# Patient Record
Sex: Male | Born: 1990 | Race: Black or African American | Hispanic: No | Marital: Married | Smoking: Never smoker
Health system: Southern US, Community
[De-identification: ages and names within clinical notes are randomized; demographics above are authoritative.]

## PROBLEM LIST (undated history)

## (undated) ENCOUNTER — Emergency Department (HOSPITAL_COMMUNITY): Payer: Self-pay | Source: Home / Self Care

---
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
## 2008-08-30 ENCOUNTER — Emergency Department: Payer: Self-pay | Admitting: Emergency Medicine

## 2009-02-11 ENCOUNTER — Emergency Department: Payer: Self-pay | Admitting: Unknown Physician Specialty

## 2009-05-18 ENCOUNTER — Emergency Department: Payer: Self-pay | Admitting: Emergency Medicine

## 2009-09-12 ENCOUNTER — Emergency Department: Payer: Self-pay | Admitting: Emergency Medicine

## 2010-09-21 IMAGING — CR DG ANKLE COMPLETE 3+V*L*
1 series · 5 of 5 positions shown · non-contrast
Comparison: none

REASON FOR EXAM: INJJURY
COMMENTS:   LMP: (Male)

PROCEDURE:     DXR - DXR ANKLE LEFT COMPLETE  - August 30, 2008  [DATE]
RESULT:     A small osseous density projects along the distal medial
malleolar region. This area is suspicious for a small avulsion fragment. No
further evidence of fracture or dislocation is appreciated.

[Series 1: view not recorded · 0.17mm/px · 5 of 5 slices shown]
[im 1/5]
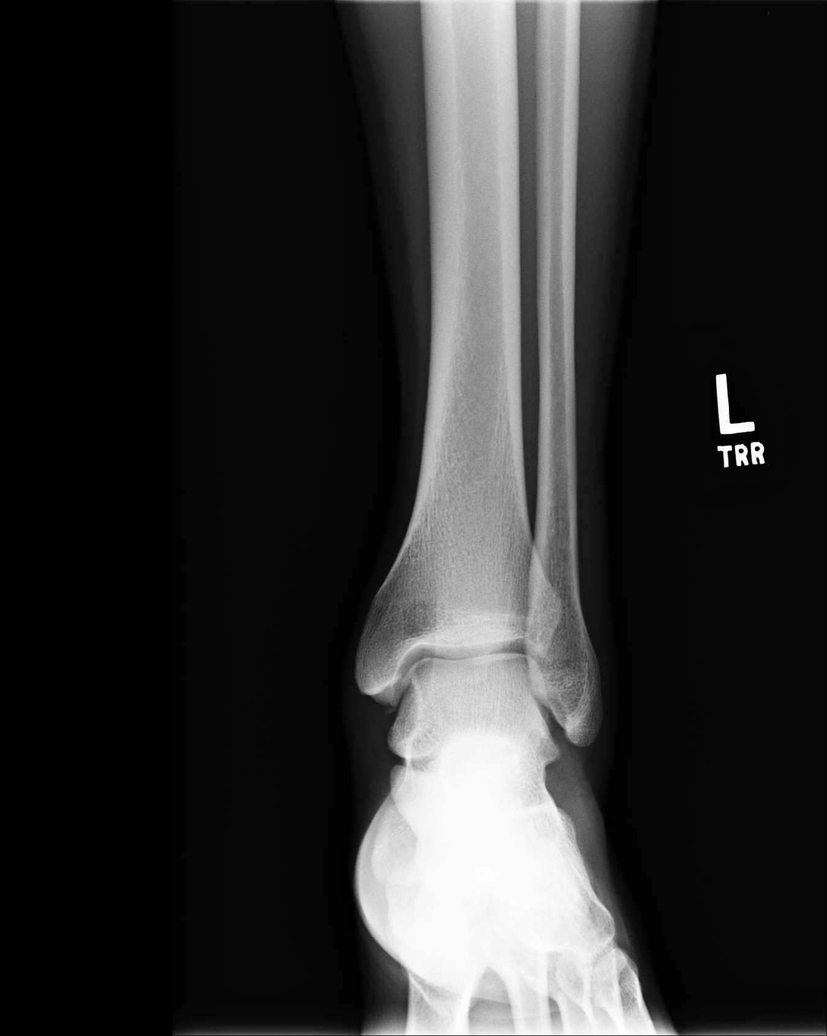
[im 2/5]
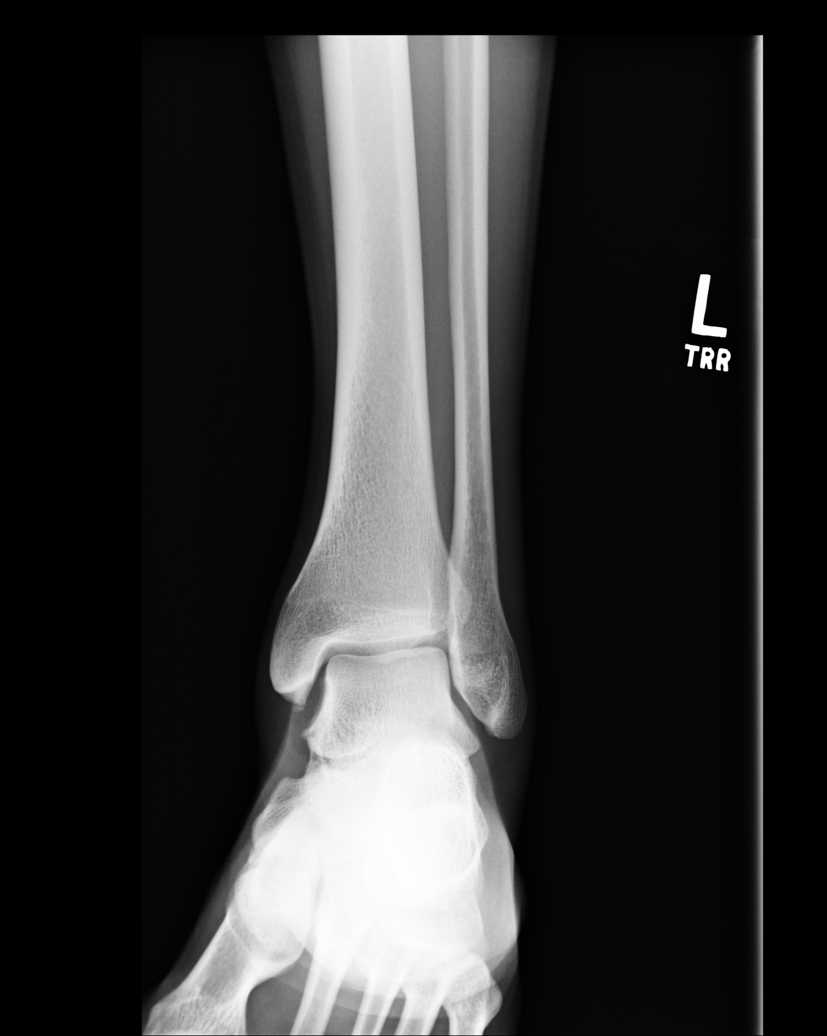
[im 3/5]
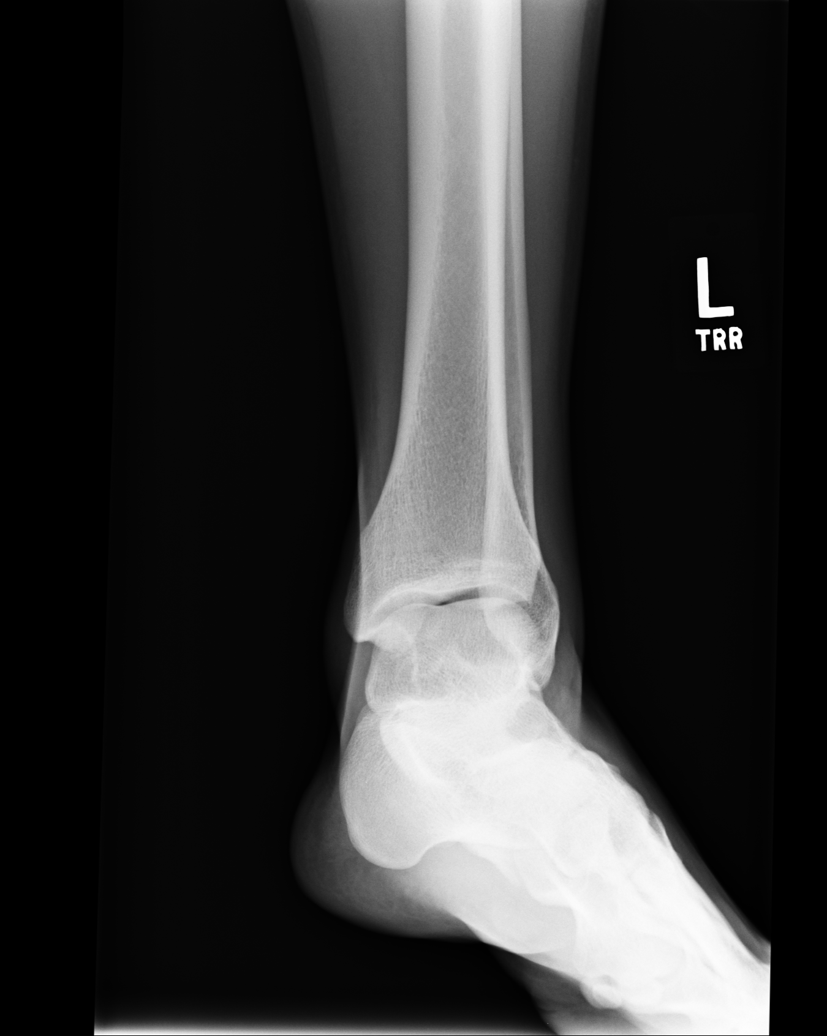
[im 4/5]
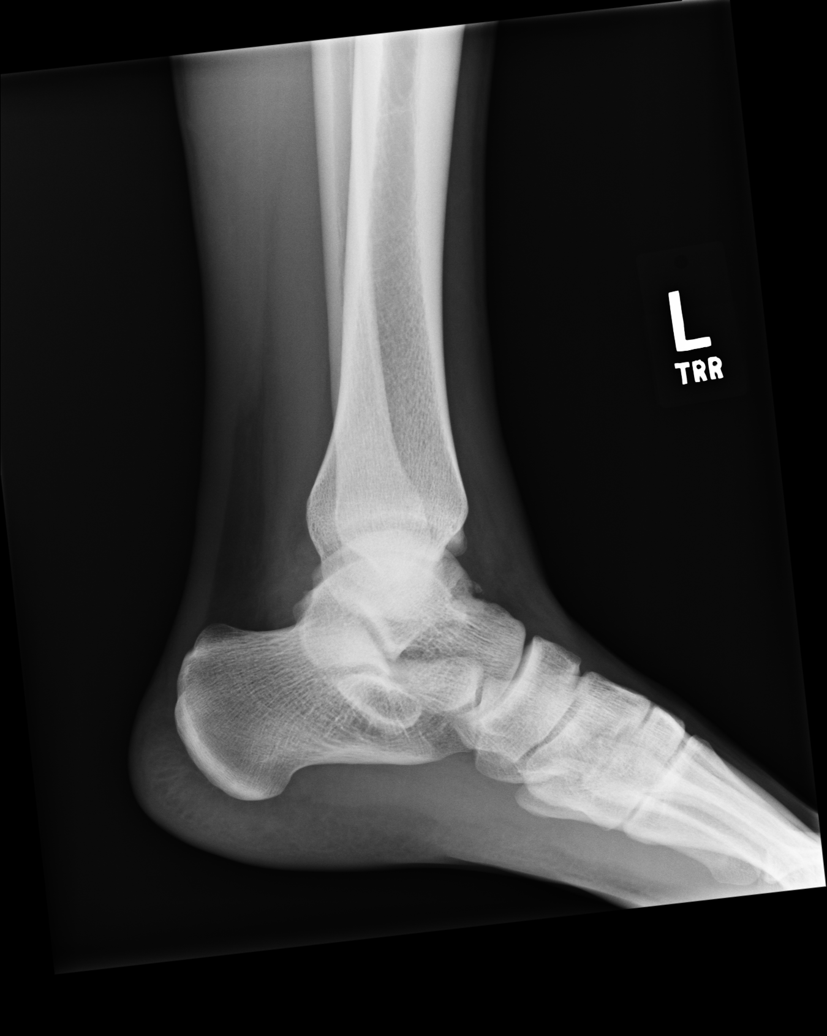
[im 5/5]
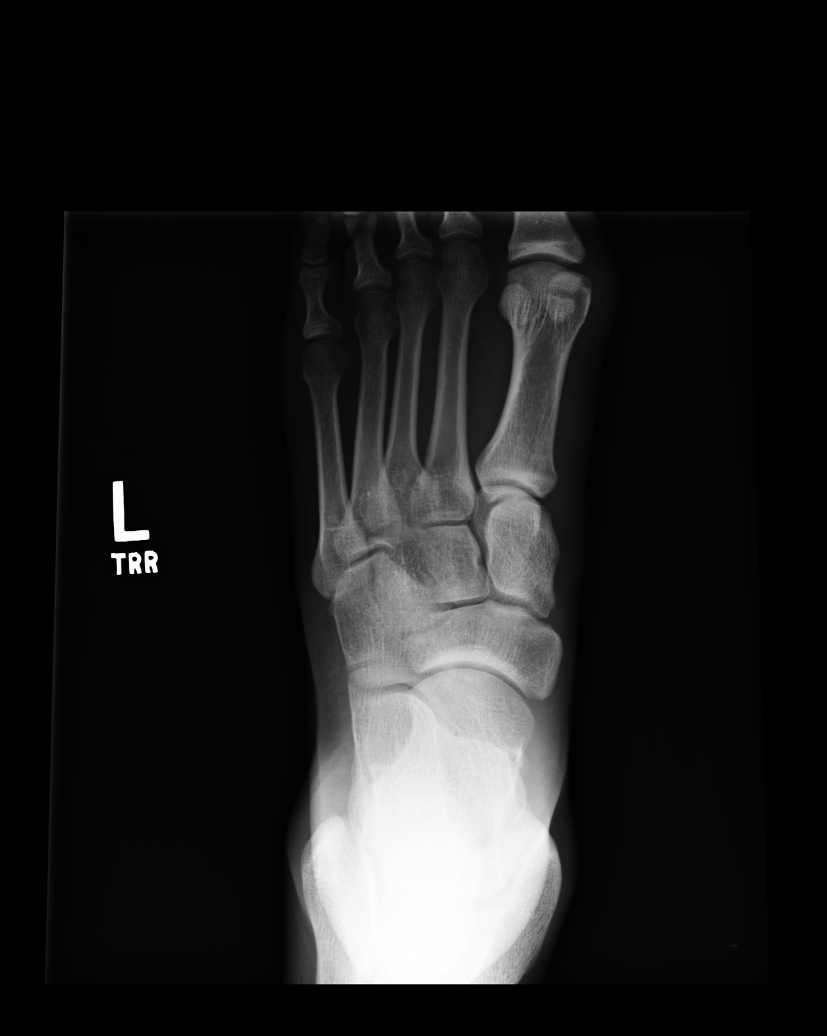

[5 of 5 positions shown; findings below may reference images not displayed]

IMPRESSION: Findings possibly representing a small avulsion fragment
along the distal medial malleolar region.

## 2010-12-11 LAB — HM HIV SCREENING LAB: HM HIV Screening: NEGATIVE

## 2011-03-03 ENCOUNTER — Emergency Department: Payer: Self-pay | Admitting: Emergency Medicine

## 2011-03-05 IMAGING — CR RIGHT THUMB 2+V
1 series · 3 of 3 positions shown · non-contrast
Comparison: No comparison

REASON FOR EXAM: injury
COMMENTS:

PROCEDURE:     DXR - DXR THUMB RIGHT HAND (1ST DIGIT)  - February 11, 2009  [DATE]
RESULT:     History: Injury

[Series 1: view not recorded · 0.17mm/px · 3 of 3 slices shown]
[im 1/3]
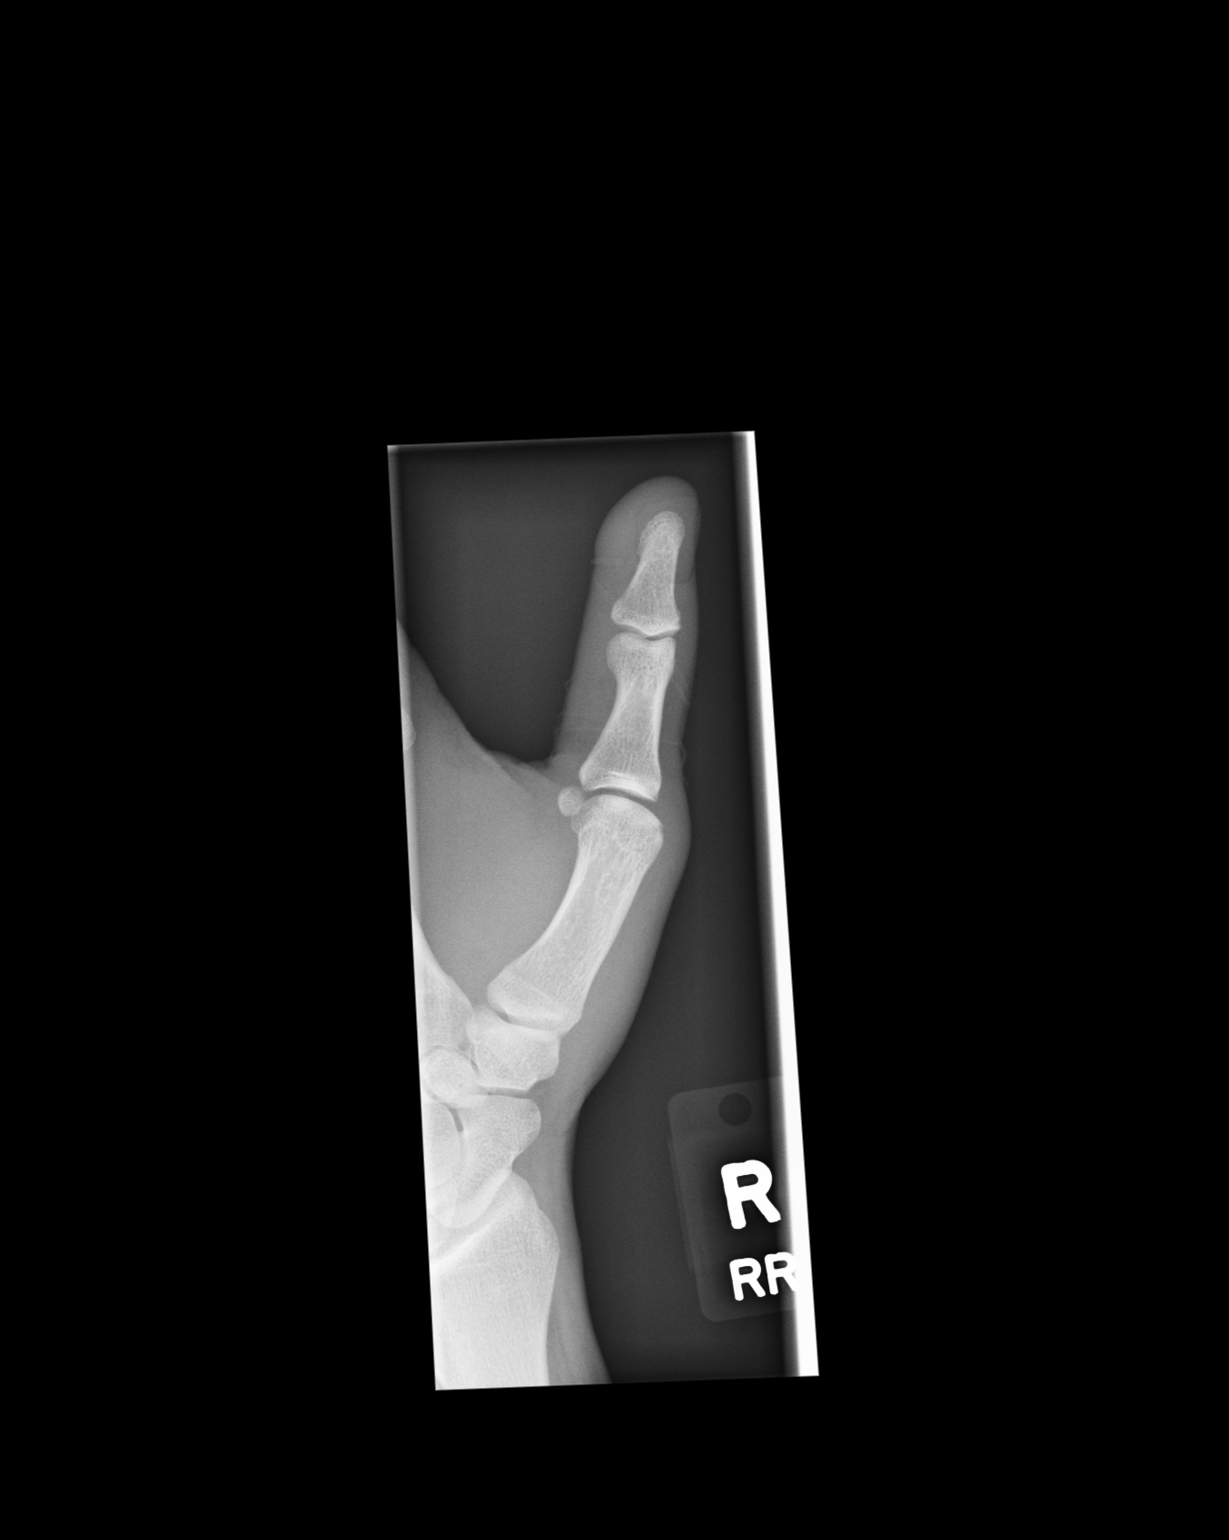
[im 2/3]
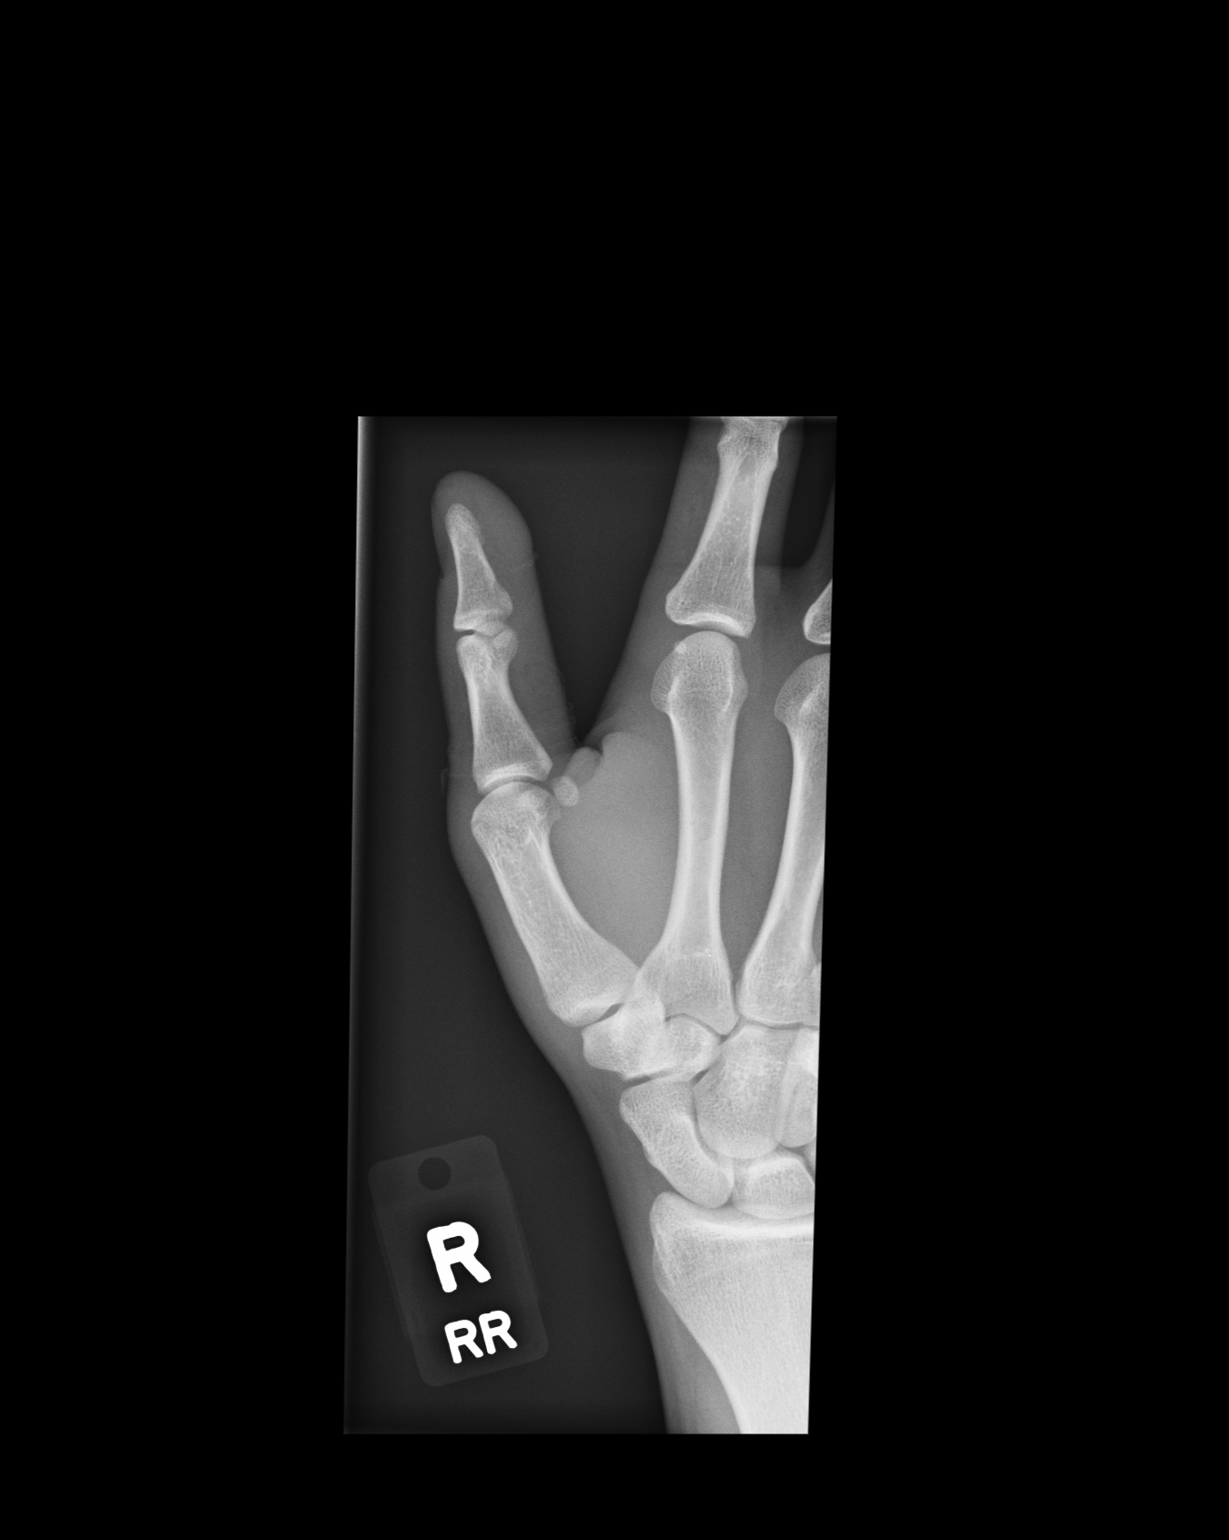
[im 3/3]
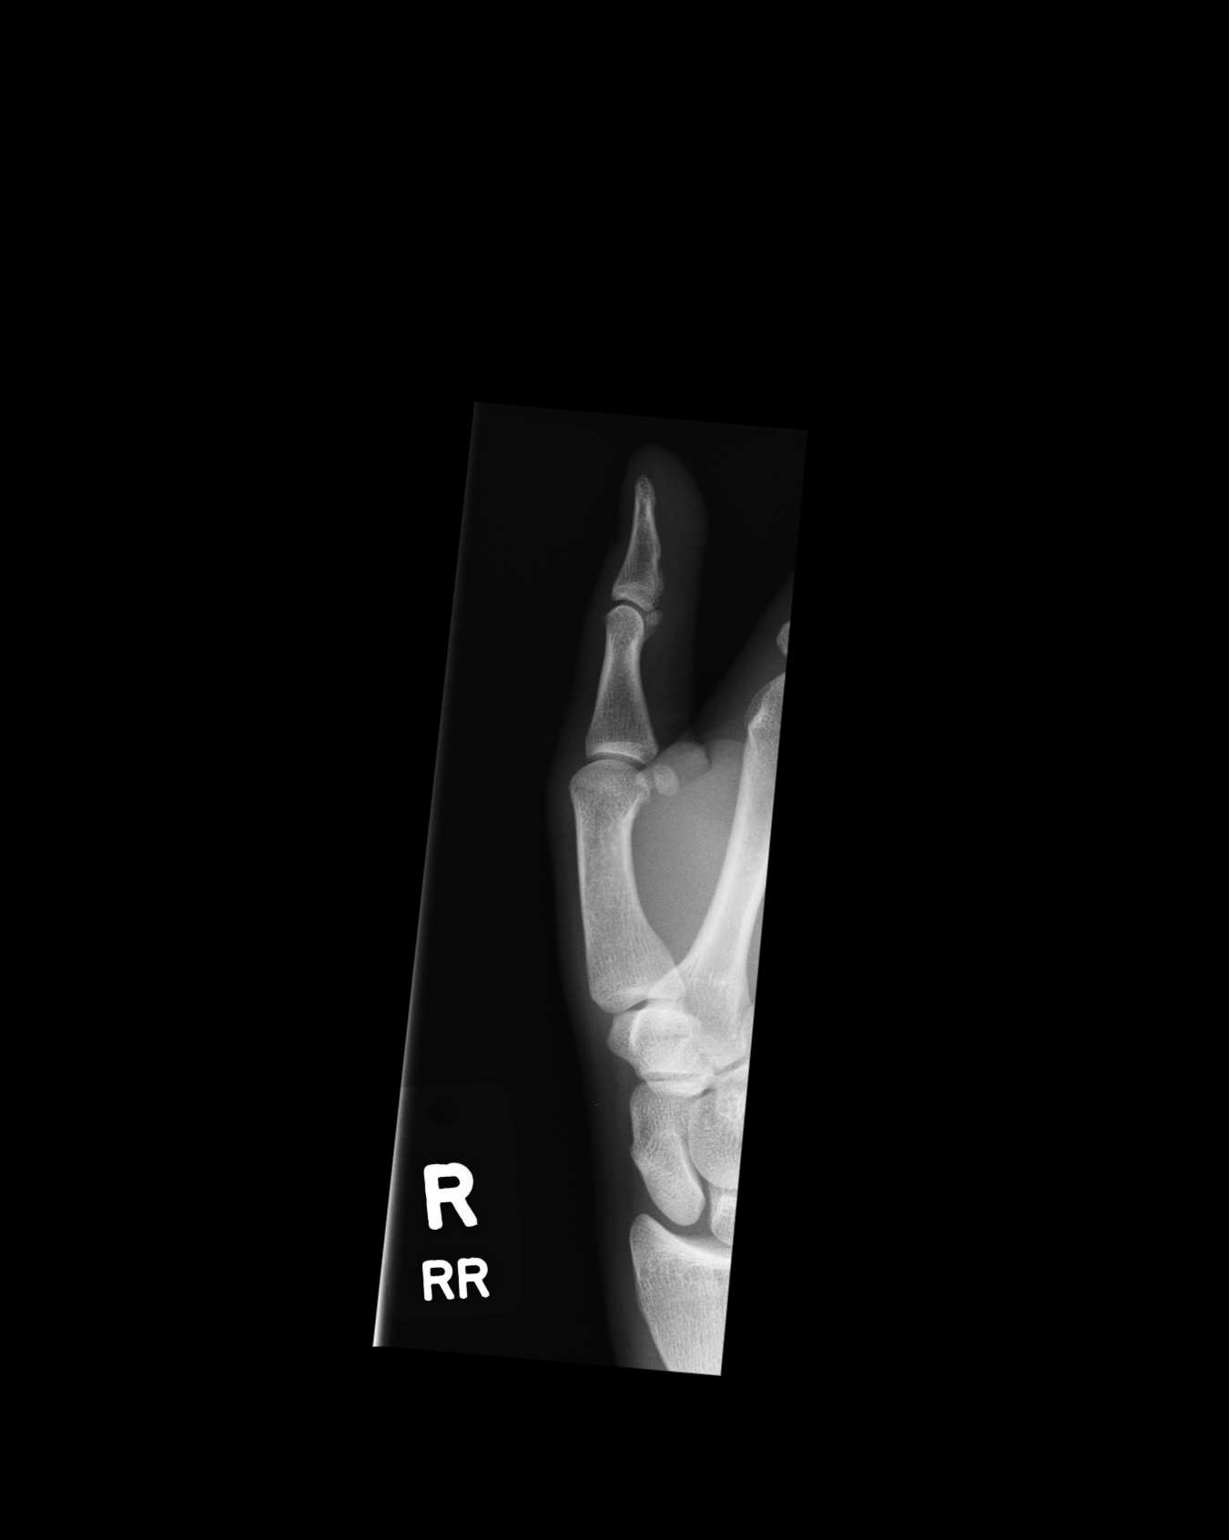

[3 of 3 positions shown; findings below may reference images not displayed]

FINDINGS: Three coned-down views of the right thumb demonstrate no fracture or
dislocation. Soft tissues are normal.
IMPRESSION: No acute osseous injury of the left thumb.

## 2012-11-29 ENCOUNTER — Emergency Department: Payer: Self-pay | Admitting: Emergency Medicine

## 2012-11-29 LAB — RAPID INFLUENZA A&B ANTIGENS

## 2013-01-12 ENCOUNTER — Emergency Department: Payer: Self-pay | Admitting: Emergency Medicine

## 2013-01-12 LAB — URINALYSIS, COMPLETE
Bacteria: NONE SEEN
Bilirubin,UR: NEGATIVE
Blood: NEGATIVE
Glucose,UR: NEGATIVE mg/dL (ref 0–75)
Leukocyte Esterase: NEGATIVE
Nitrite: NEGATIVE
Ph: 6 (ref 4.5–8.0)
Protein: NEGATIVE
RBC,UR: 1 /HPF (ref 0–5)
Specific Gravity: 1.028 (ref 1.003–1.030)
Squamous Epithelial: <1
WBC UR: 4 /HPF (ref 0–5)

## 2013-01-12 LAB — RAPID INFLUENZA A&B ANTIGENS

## 2013-02-07 ENCOUNTER — Emergency Department: Payer: Self-pay | Admitting: Emergency Medicine

## 2013-02-07 LAB — COMPREHENSIVE METABOLIC PANEL
Albumin: 4.1 g/dL (ref 3.4–5.0)
Alkaline Phosphatase: 82 U/L (ref 50–136)
Anion Gap: 3 — ABNORMAL LOW (ref 7–16)
BUN: 15 mg/dL (ref 7–18)
Bilirubin,Total: 0.5 mg/dL (ref 0.2–1.0)
Calcium, Total: 9 mg/dL (ref 8.5–10.1)
Chloride: 104 mmol/L (ref 98–107)
Co2: 31 mmol/L (ref 21–32)
Creatinine: 1.05 mg/dL (ref 0.60–1.30)
EGFR (African American): >60
EGFR (Non-African Amer.): >60
Glucose: 96 mg/dL (ref 65–99)
Osmolality: 276 (ref 275–301)
Potassium: 3.8 mmol/L (ref 3.5–5.1)
SGOT(AST): 28 U/L (ref 15–37)
SGPT (ALT): 27 U/L (ref 12–78)
Sodium: 138 mmol/L (ref 136–145)
Total Protein: 7.7 g/dL (ref 6.4–8.2)

## 2013-02-07 LAB — URINALYSIS, COMPLETE
Bilirubin,UR: NEGATIVE
Blood: NEGATIVE
Glucose,UR: NEGATIVE mg/dL (ref 0–75)
Ketone: NEGATIVE
Nitrite: NEGATIVE
Ph: 7 (ref 4.5–8.0)
Protein: NEGATIVE
RBC,UR: 2 /HPF (ref 0–5)
Specific Gravity: 1.024 (ref 1.003–1.030)
Squamous Epithelial: <1
WBC UR: 10 /HPF (ref 0–5)

## 2013-02-07 LAB — CBC
HCT: 44.8 % (ref 40.0–52.0)
HGB: 14.7 g/dL (ref 13.0–18.0)
MCH: 27.8 pg (ref 26.0–34.0)
MCHC: 32.9 g/dL (ref 32.0–36.0)
MCV: 85 fL (ref 80–100)
Platelet: 235 10*3/uL (ref 150–440)
RBC: 5.3 10*6/uL (ref 4.40–5.90)
RDW: 13.6 % (ref 11.5–14.5)
WBC: 7.3 10*3/uL (ref 3.8–10.6)

## 2013-02-15 ENCOUNTER — Emergency Department: Payer: Self-pay | Admitting: Unknown Physician Specialty

## 2013-02-19 ENCOUNTER — Emergency Department: Payer: Self-pay | Admitting: Emergency Medicine

## 2013-12-11 ENCOUNTER — Emergency Department: Payer: Self-pay | Admitting: Emergency Medicine

## 2013-12-11 LAB — CBC
HCT: 43.9 % (ref 40.0–52.0)
HGB: 14.4 g/dL (ref 13.0–18.0)
MCH: 27.3 pg (ref 26.0–34.0)
MCHC: 32.7 g/dL (ref 32.0–36.0)
MCV: 83 fL (ref 80–100)
Platelet: 246 10*3/uL (ref 150–440)
RBC: 5.26 10*6/uL (ref 4.40–5.90)
RDW: 13.7 % (ref 11.5–14.5)
WBC: 7.7 10*3/uL (ref 3.8–10.6)

## 2013-12-11 LAB — CLOSTRIDIUM DIFFICILE(ARMC)

## 2013-12-11 LAB — BASIC METABOLIC PANEL
Anion Gap: 3 — ABNORMAL LOW (ref 7–16)
BUN: 13 mg/dL (ref 7–18)
CALCIUM: 9.6 mg/dL (ref 8.5–10.1)
CHLORIDE: 102 mmol/L (ref 98–107)
Co2: 30 mmol/L (ref 21–32)
Creatinine: 1.09 mg/dL (ref 0.60–1.30)
EGFR (African American): >60
Glucose: 83 mg/dL (ref 65–99)
OSMOLALITY: 269 (ref 275–301)
Potassium: 3.8 mmol/L (ref 3.5–5.1)
SODIUM: 135 mmol/L — AB (ref 136–145)

## 2013-12-14 LAB — STOOL CULTURE

## 2014-02-17 ENCOUNTER — Emergency Department: Payer: Self-pay | Admitting: Emergency Medicine

## 2014-05-05 ENCOUNTER — Emergency Department: Payer: Self-pay

## 2014-08-26 ENCOUNTER — Emergency Department: Payer: Self-pay | Admitting: Emergency Medicine

## 2014-08-26 LAB — BASIC METABOLIC PANEL
Anion Gap: 6 — ABNORMAL LOW (ref 7–16)
BUN: 13 mg/dL (ref 7–18)
Calcium, Total: 8.6 mg/dL (ref 8.5–10.1)
Chloride: 106 mmol/L (ref 98–107)
Co2: 29 mmol/L (ref 21–32)
Creatinine: 1.32 mg/dL — ABNORMAL HIGH (ref 0.60–1.30)
EGFR (African American): >60
EGFR (Non-African Amer.): >60
Glucose: 117 mg/dL — ABNORMAL HIGH (ref 65–99)
Osmolality: 282 (ref 275–301)
Potassium: 3.8 mmol/L (ref 3.5–5.1)
Sodium: 141 mmol/L (ref 136–145)

## 2014-08-26 LAB — CBC WITH DIFFERENTIAL/PLATELET
Basophil #: 0 10*3/uL (ref 0.0–0.1)
Basophil %: 0.6 %
Eosinophil #: 0.1 10*3/uL (ref 0.0–0.7)
Eosinophil %: 1.8 %
HCT: 44 % (ref 40.0–52.0)
HGB: 14 g/dL (ref 13.0–18.0)
Lymphocyte #: 2.7 10*3/uL (ref 1.0–3.6)
Lymphocyte %: 35.3 %
MCH: 27.4 pg (ref 26.0–34.0)
MCHC: 31.8 g/dL — ABNORMAL LOW (ref 32.0–36.0)
MCV: 86 fL (ref 80–100)
Monocyte #: 0.5 x10 3/mm (ref 0.2–1.0)
Monocyte %: 6.6 %
Neutrophil #: 4.2 10*3/uL (ref 1.4–6.5)
Neutrophil %: 55.7 %
Platelet: 254 10*3/uL (ref 150–440)
RBC: 5.1 10*6/uL (ref 4.40–5.90)
RDW: 14.1 % (ref 11.5–14.5)
WBC: 7.5 10*3/uL (ref 3.8–10.6)

## 2014-08-27 ENCOUNTER — Emergency Department: Payer: Self-pay | Admitting: Internal Medicine

## 2014-10-10 ENCOUNTER — Emergency Department: Payer: Self-pay | Admitting: Emergency Medicine

## 2015-03-11 ENCOUNTER — Emergency Department
Admit: 2015-03-11 | Disposition: A | Discharge status: Home / Self Care | Payer: Self-pay | Admitting: Emergency Medicine

## 2016-03-25 ENCOUNTER — Emergency Department
Admission: EM | Admit: 2016-03-25 | Discharge: 2016-03-25 | Disposition: A | Discharge status: Home / Self Care | Payer: Self-pay | Attending: Emergency Medicine | Admitting: Emergency Medicine

## 2016-03-25 DIAGNOSIS — B351 Tinea unguium: Secondary | ICD-10-CM | POA: Insufficient documentation

## 2016-03-25 DIAGNOSIS — B353 Tinea pedis: Secondary | ICD-10-CM | POA: Insufficient documentation

## 2016-03-25 MED ORDER — IBUPROFEN 800 MG PO TABS: 800.0000 mg | ORAL_TABLET | Freq: Three times a day (TID) | ORAL | Status: DC | PRN

## 2016-03-25 MED ORDER — TERBINAFINE HCL 250 MG PO TABS: 250.0000 mg | ORAL_TABLET | Freq: Every day | ORAL | Status: DC

## 2016-03-25 MED ORDER — HYDROCODONE-ACETAMINOPHEN 5-325 MG PO TABS
1.0000 | ORAL_TABLET | ORAL | Status: DC | PRN
Start: 2016-03-25 — End: 2018-06-16

## 2016-03-25 NOTE — ED Notes (Signed)
See triage note  States he has been having pain to right foot for several days   Callous noted note with fungus area noted to foot

## 2016-03-25 NOTE — Discharge Instructions (Signed)
Athlete's Foot Athlete's foot (tinea pedis) is a fungal infection of the skin on the feet. It often occurs on the skin between the toes or underneath the toes. It can also occur on the soles of the feet. Athlete's foot is more likely to occur in hot, humid weather. Not washing your feet or changing your socks often enough can contribute to athlete's foot. The infection can spread from person to person (contagious). CAUSES Athlete's foot is caused by a fungus. This fungus thrives in warm, moist places. Most people get athlete's foot by sharing shower stalls, towels, and wet floors with an infected person. People with weakened immune systems, including those with diabetes, may be more likely to get athlete's foot. SYMPTOMS   Itchy areas between the toes or on the soles of the feet.  White, flaky, or scaly areas between the toes or on the soles of the feet.  Tiny, intensely itchy blisters between the toes or on the soles of the feet.  Tiny cuts on the skin. These cuts can develop a bacterial infection.  Thick or discolored toenails. DIAGNOSIS  Your caregiver can usually tell what the problem is by doing a physical exam. Your caregiver may also take a skin sample from the rash area. The skin sample may be examined under a microscope, or it may be tested to see if fungus will grow in the sample. A sample may also be taken from your toenail for testing. TREATMENT  Over-the-counter and prescription medicines can be used to kill the fungus. These medicines are available as powders or creams. Your caregiver can suggest medicines for you. Fungal infections respond slowly to treatment. You may need to continue using your medicine for several weeks. PREVENTION   Do not share towels.  Wear sandals in wet areas, such as shared locker rooms and shared showers.  Keep your feet dry. Wear shoes that allow air to circulate. Wear cotton or wool socks. HOME CARE INSTRUCTIONS   Take medicines as directed by  your caregiver. Do not use steroid creams on athlete's foot.  Keep your feet clean and cool. Wash your feet daily and dry them thoroughly, especially between your toes.  Change your socks every day. Wear cotton or wool socks. In hot climates, you may need to change your socks 2 to 3 times per day.  Wear sandals or canvas tennis shoes with good air circulation.  If you have blisters, soak your feet in Burow's solution or Epsom salts for 20 to 30 minutes, 2 times a day to dry out the blisters. Make sure you dry your feet thoroughly afterward. SEEK MEDICAL CARE IF:   You have a fever.  You have swelling, soreness, warmth, or redness in your foot.  You are not getting better after 7 days of treatment.  You are not completely cured after 30 days.  You have any problems caused by your medicines. MAKE SURE YOU:   Understand these instructions.  Will watch your condition.  Will get help right away if you are not doing well or get worse.   This information is not intended to replace advice given to you by your health care provider. Make sure you discuss any questions you have with your health care provider.   Document Released: 11/08/2000 Document Revised: 02/03/2012 Document Reviewed: 05/15/2015 Elsevier Interactive Patient Education 2016 [employer] Nail Ringworm A fungal infection of the nail (tinea unguium/onychomycosis) is common. It is common as the visible part of the nail is composed of dead cells   which have no blood supply to help prevent infection. It occurs because fungi are everywhere and will pick any opportunity to grow on any dead material. Because nails are very slow growing they require up to 2 years of treatment with anti-fungal medications. The entire nail back to the base is infected. This includes approximately  of the nail which you cannot see. If your caregiver has prescribed a medication by mouth, take it every day and as directed. No progress will be seen for at  least 6 to 9 months. Do not be disappointed! Because fungi live on dead cells with little or no exposure to blood supply, medication delivery to the infection is slow; thus the cure is slow. It is also why you can observe no progress in the first 6 months. The nail becoming cured is the base of the nail, as it has the blood supply. Topical medication such as creams and ointments are usually not effective. Important in successful treatment of nail fungus is closely following the medication regimen that your doctor prescribes. Sometimes you and your caregiver may elect to speed up this process by surgical removal of all the nails. Even this may still require 6 to 9 months of additional oral medications. See your caregiver as directed. Remember there will be no visible improvement for at least 6 months. See your caregiver sooner if other signs of infection (redness and swelling) develop.   This information is not intended to replace advice given to you by your health care provider. Make sure you discuss any questions you have with your health care provider.   Document Released: 11/08/2000 Document Revised: 03/28/2015 Document Reviewed: 05/15/2015 Elsevier Interactive Patient Education 2016 [employer]vier Inc[employer]  

## 2016-03-25 NOTE — ED Provider Notes (Signed)
[facility] Emergency Department Provider Note  ____________________________________________  Time seen: Approximately 8:05 AM  I have reviewed the triage vital signs and the nursing notes.   HISTORY  Chief Complaint Foot Pain    HPI Juan Cook is a 25 y.o. male presents with a 2 day history of toenail pain and hard plus a callus on his foot. Patient states not taking any medication over-the-counter and hurts to walk. Desires referral to someone who can fix's nails.   History reviewed. No pertinent past medical history.  There are no active problems to display for this patient.   History reviewed. No pertinent past surgical history.  Current Outpatient Rx  Name  Route  Sig  Dispense  Refill  . HYDROcodone-acetaminophen (NORCO) 5-325 MG tablet   Oral   Take 1-2 tablets by mouth every 4 (four) hours as needed for moderate pain.   10 tablet   0   . ibuprofen (ADVIL,MOTRIN) 800 MG tablet   Oral   Take 1 tablet (800 mg total) by mouth every 8 (eight) hours as needed.   30 tablet   0   . terbinafine (LAMISIL) 250 MG tablet   Oral   Take 1 tablet (250 mg total) by mouth daily.   42 tablet   0     Allergies Review of patient's allergies indicates no known allergies.  No family history on file.  Social History Social History  Substance Use Topics  . Smoking status: Never Smoker   . Smokeless tobacco: None  . Alcohol Use: No    Review of Systems Constitutional: No fever/chills Eyes: No visual changes. ENT: No sore throat. Cardiovascular: Denies chest pain. Respiratory: Denies shortness of breath. Gastrointestinal: No abdominal pain.  No nausea, no vomiting.  No diarrhea.  No constipation. Genitourinary: Negative for dysuria. Musculoskeletal: Positive toenail pain. With callus toenails Skin: Positive for skin peeling on the soles of his right foot. Neurological: Negative for headaches, focal weakness or numbness.  10-point  ROS otherwise negative.  ____________________________________________   PHYSICAL EXAM:  VITAL SIGNS: ED Triage Vitals  Enc Vitals Group     BP 03/25/16 0801 122/64 mmHg     Pulse Rate 03/25/16 0801 44     Resp 03/25/16 0801 15     Temp 03/25/16 0801 97.7 F (36.5 C)     Temp Source 03/25/16 0801 Oral     SpO2 03/25/16 0801 98 %     Weight 03/25/16 0801 148 lb (67.132 kg)     Height 03/25/16 0801  (1.549 m)     Head Cir --      Peak Flow --      Pain Score 03/25/16 0802 7     Pain Loc --      Pain Edu? --      Excl. in GC? --     Constitutional: Alert and oriented. Well appearing and in no acute distress. Neurologic:  Normal speech and language. No gross focal neurologic deficits are appreciated. No gait instability. Skin:  Various colors of skin peeling. Toenails calloused on the second and fourth digit. Thickened consistent with tinea pedis. Psychiatric: Mood and affect are normal. Speech and behavior are normal.  ____________________________________________   LABS (all labs ordered are listed, but only abnormal results are displayed)  Labs Reviewed - No data to display ____________________________________________  EKG   ____________________________________________  RADIOLOGY   ____________________________________________   PROCEDURES  Procedure(s) performed: None  Critical Care performed: No  ____________________________________________  INITIAL IMPRESSION / ASSESSMENT AND PLAN / ED COURSE  Pertinent labs & imaging results that were available during my care of the patient were reviewed by me and considered in my medical decision making (see chart for details). Tinea pedis with onychomycosis. Reassurance provided to start patient on Lamisil 250 mg daily Motrin 800 mg as needed for inflammation and pain and follow-up with podiatry on call. She voices no other emergency medical complaints at this  time. ____________________________________________   FINAL CLINICAL IMPRESSION(S) / ED DIAGNOSES  Final diagnoses:  Tinea pedis of right foot  Onychomycosis of toenail     This chart was dictated using voice recognition software/Dragon. Despite best efforts to proofread, errors can occur which can change the meaning. Any change was purely unintentional.   Evangeline Dakinharles M Beers, PA-C 03/25/16 [id number]  Jeanmarie PlantJames A McShane, MD 03/25/16 [phone]

## 2016-03-25 NOTE — ED Notes (Signed)
Pt c/o right foot pain for the past 2 days, denies injury.. Pt c/o issues with toe nail on 2nd toe also c/o hard area (callus) on the bottom of right foot.

## 2016-09-07 ENCOUNTER — Encounter: Payer: Self-pay | Admitting: Emergency Medicine

## 2016-09-07 ENCOUNTER — Emergency Department
Admission: EM | Admit: 2016-09-07 | Discharge: 2016-09-07 | Disposition: A | Discharge status: Home / Self Care | Payer: Self-pay | Attending: Emergency Medicine | Admitting: Emergency Medicine

## 2016-09-07 DIAGNOSIS — Z791 Long term (current) use of non-steroidal anti-inflammatories (NSAID): Secondary | ICD-10-CM | POA: Insufficient documentation

## 2016-09-07 DIAGNOSIS — R197 Diarrhea, unspecified: Secondary | ICD-10-CM

## 2016-09-07 DIAGNOSIS — B349 Viral infection, unspecified: Secondary | ICD-10-CM | POA: Insufficient documentation

## 2016-09-07 DIAGNOSIS — Z79899 Other long term (current) drug therapy: Secondary | ICD-10-CM | POA: Insufficient documentation

## 2016-09-07 DIAGNOSIS — R112 Nausea with vomiting, unspecified: Secondary | ICD-10-CM

## 2016-09-07 LAB — INFLUENZA PANEL BY PCR (TYPE A & B)
H1N1 flu by pcr: NOT DETECTED
Influenza A By PCR: NEGATIVE
Influenza B By PCR: NEGATIVE

## 2016-09-07 MED ORDER — PROMETHAZINE HCL 25 MG PO TABS: 25.0000 mg | ORAL_TABLET | Freq: Four times a day (QID) | ORAL | 0 refills | Status: DC | PRN

## 2016-09-07 NOTE — ED Notes (Signed)
Not able to complete order. Pt refused a POCT Rapid Strep A.

## 2016-09-07 NOTE — ED Provider Notes (Signed)
[facility] Emergency Department Provider Note  ____________________________________________  Time seen: Approximately 7:39 PM  I have reviewed the triage vital signs and the nursing notes.   HISTORY  Chief Complaint Generalized Body Aches    HPI Juan Cook is a 25 y.o. male, NAD, presents to the emergency room with 3 hour history of nausea, vomiting and diarrhea. Patient states his child has had a viral stomach illness over the last few days. He felt somewhat fatigued and without normal appetite yesterday. Went to work today, ate a fried chicken sandwich and within 30 minutes had onset of vomiting, lower abdominal cramping and watery diarrhea. States the symptoms have resolved and he has had no further vomiting, abdominal pain or diarrhea since that one episode. He does state that his stomach feels "gurgly". Denies any fevers but has felt chilled and fatigued. Denies sore throat, sinus pressure, nasal congestion, ear pain. Has had no chest pain, shortness of breath, diaphoresis, numbness, weakness, tingling. 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   History reviewed. No pertinent past medical history.  There are no active problems to display for this patient.   History reviewed. No pertinent surgical history.  Prior to Admission medications   Medication Sig Start Date End Date Taking? Authorizing Provider  HYDROcodone-acetaminophen (NORCO) 5-325 MG tablet Take 1-2 tablets by mouth every 4 (four) hours as needed for moderate pain. 03/25/16   Charmayne Sheerharles M Beers, PA-C  ibuprofen (ADVIL,MOTRIN) 800 MG tablet Take 1 tablet (800 mg total) by mouth every 8 (eight) hours as needed. 03/25/16   Evangeline Dakinharles M Beers, PA-C  promethazine (PHENERGAN) 25 MG tablet Take 1 tablet (25 mg total) by mouth every 6 (six) hours as needed for nausea or vomiting. 09/07/16   Kammi Hechler L Gracelyn Coventry, PA-C  terbinafine (LAMISIL)  250 MG tablet Take 1 tablet (250 mg total) by mouth daily. 03/25/16   Evangeline Dakinharles M Beers, PA-C    Allergies Review of patient's allergies indicates no known allergies.  No family history on file.  Social History Social History  Substance Use Topics  . Smoking status: Never Smoker  . Smokeless tobacco: Never Used  . Alcohol use No     Review of Systems  Constitutional: Positive chills, fatigue, decreased appetite. No fevers. ENT: No sore throat, Nasal congestion, ear pain, sinus pressure. Cardiovascular: No chest pain. Respiratory: No cough. No shortness of breath. No wheezing.  Gastrointestinal: Positive lower abdominal cramping that is alleviated by bowel movement.  Positive nausea, vomiting, diarrhea. No constipation, hematemesis, hematochezia. No rectal pain.  Genitourinary: Negative for dysuria, hematuria. No urinary hesitancy, urgency or increased frequency. Musculoskeletal: Negative for back or flank pain.  Skin: Negative for rash. Neurological: Negative for headaches, focal weakness or numbness. No saddle paresthesias or loss of bowel or bladder control 10-point ROS otherwise negative.  ____________________________________________   PHYSICAL EXAM:  VITAL SIGNS: ED Triage Vitals  Enc Vitals Group     BP 09/07/16 1854 123/68     Pulse Rate 09/07/16 1854 62     Resp 09/07/16 1854 18     Temp 09/07/16 1854 98.6 F (37 C)     Temp Source 09/07/16 1854 Oral     SpO2 09/07/16 1854 97 %     Weight 09/07/16 1855 160 lb (72.6 kg)     Height 09/07/16 1855 5\' 8"  (1.727 m)     Head Circumference --  Peak Flow --      Pain Score 09/07/16 1855 5     Pain Loc --      Pain Edu? --      Excl. in GC? --      Constitutional: Alert and oriented. Well appearing and in no acute distress. Eyes: Conjunctivae are normal Without icterus or injection. Head: Atraumatic. ENT:      Ears: TMs visualized bilaterally without erythema, effusion, bulging, perforation.      Nose: No  congestion/rhinnorhea.      Mouth/Throat: Mucous membranes are moist. Face without erythema, swelling, exudate. Neck: Supple with full range of motion. Hematological/Lymphatic/Immunilogical: No cervical lymphadenopathy. Cardiovascular: Normal rate, regular rhythm. Normal S1 and S2.  Good peripheral circulation. Respiratory: Normal respiratory effort without tachypnea or retractions. Lungs CTAB with breath sounds noted in all lung fields. No wheeze, rhonchi, rales. Gastrointestinal: Soft and nontender without distention or guarding in all quadrants. No rebound or rigidity. No CVA tenderness. Bowel sounds slightly hyperactive in all quadrants without any abnormal tones. Musculoskeletal: Full range of motion of bilateral upper and lower extremities without pain or difficulty. Neurologic:  Normal speech and language. No gross focal neurologic deficits are appreciated.  Skin:  Skin is warm, dry and intact. No rash noted. Psychiatric: Mood and affect are normal. Speech and behavior are normal. Patient exhibits appropriate insight and judgement.   ____________________________________________   LABS (all labs ordered are listed, but only abnormal results are displayed)  Labs Reviewed  INFLUENZA PANEL BY PCR (TYPE A & B, H1N1)   ____________________________________________  EKG  None ____________________________________________  RADIOLOGY  None ____________________________________________    PROCEDURES  Procedure(s) performed: None   Procedures   Medications - No data to display   ____________________________________________   INITIAL IMPRESSION / ASSESSMENT AND PLAN / ED COURSE  Pertinent labs & imaging results that were available during my care of the patient were reviewed by me and considered in my medical decision making (see chart for details).  Clinical Course  Comment By Time  After physical exam patient notes that he does not wish to wait the 90 minutes for the  flu test to return. He feels he has the same stomach virus that his daughter had and would prefer to be given a work note and be discharged. Patient's exam is benign and I feel there is no need for further laboratory evaluation or imaging at this time. His symptoms are consistent with viral illness. Hope Pigeon, PA-C 10/14 1958    Patient's diagnosis is consistent with Nausea, vomiting and diarrhea due to viral syndrome. Patient will be discharged home with prescriptions for promethazine to take as needed for nausea. 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 Patient is to follow up with [facility] if symptoms persist past this treatment course. Patient is given ED precautions to return to the ED for any worsening or new symptoms.    ____________________________________________  FINAL CLINICAL IMPRESSION(S) / ED DIAGNOSES  Final diagnoses:  Nausea vomiting and diarrhea  Viral syndrome      NEW MEDICATIONS STARTED DURING THIS VISIT:  Discharge Medication List as of 09/07/2016  8:00 PM    START taking these medications   Details  promethazine (PHENERGAN) 25 MG tablet Take 1 tablet (25 mg total) by mouth every 6 (six) hours as needed for nausea or vomiting., Starting Sat 09/07/2016, Print             Reade Trefz L Phyllis Whitefield, PA-C  09/07/16 2045    Nita Sickle, MD 09/09/16 1104

## 2016-09-07 NOTE — ED Triage Notes (Signed)
States he developed some body aches   Fever /chills while at work today  Had some n/v  Last time vomited around 6 pm

## 2018-06-16 ENCOUNTER — Emergency Department
Admission: EM | Admit: 2018-06-16 | Discharge: 2018-06-16 | Disposition: A | Discharge status: Home / Self Care | Payer: Self-pay | Attending: Emergency Medicine | Admitting: Emergency Medicine

## 2018-06-16 ENCOUNTER — Encounter: Payer: Self-pay | Admitting: Emergency Medicine

## 2018-06-16 ENCOUNTER — Other Ambulatory Visit: Payer: Self-pay

## 2018-06-16 DIAGNOSIS — R112 Nausea with vomiting, unspecified: Secondary | ICD-10-CM | POA: Insufficient documentation

## 2018-06-16 LAB — URINALYSIS, COMPLETE (UACMP) WITH MICROSCOPIC
BILIRUBIN URINE: NEGATIVE
Bacteria, UA: NONE SEEN
GLUCOSE, UA: NEGATIVE mg/dL
Hgb urine dipstick: NEGATIVE
Ketones, ur: 20 mg/dL — AB
LEUKOCYTES UA: NEGATIVE
Nitrite: NEGATIVE
Protein, ur: NEGATIVE mg/dL
SPECIFIC GRAVITY, URINE: 1.028 (ref 1.005–1.030)
pH: 5 (ref 5.0–8.0)

## 2018-06-16 LAB — COMPREHENSIVE METABOLIC PANEL
ALK PHOS: 57 U/L (ref 38–126)
ALT: 14 U/L (ref 0–44)
ANION GAP: 4 — AB (ref 5–15)
AST: 26 U/L (ref 15–41)
Albumin: 3.4 g/dL — ABNORMAL LOW (ref 3.5–5.0)
BUN: 12 mg/dL (ref 6–20)
CALCIUM: 8.8 mg/dL — AB (ref 8.9–10.3)
CO2: 27 mmol/L (ref 22–32)
Chloride: 109 mmol/L (ref 98–111)
Creatinine, Ser: 1.22 mg/dL (ref 0.61–1.24)
GFR calc Af Amer: >60 mL/min (ref >60)
GLUCOSE: 88 mg/dL (ref 70–99)
POTASSIUM: 3.9 mmol/L (ref 3.5–5.1)
Sodium: 140 mmol/L (ref 135–145)
TOTAL PROTEIN: 5.8 g/dL — AB (ref 6.5–8.1)
Total Bilirubin: 1 mg/dL (ref 0.3–1.2)

## 2018-06-16 LAB — CBC
HEMATOCRIT: 45.4 % (ref 40.0–52.0)
HEMOGLOBIN: 16 g/dL (ref 13.0–18.0)
MCH: 29.8 pg (ref 26.0–34.0)
MCHC: 35.3 g/dL (ref 32.0–36.0)
MCV: 84.4 fL (ref 80.0–100.0)
Platelets: 296 10*3/uL (ref 150–440)
RBC: 5.37 MIL/uL (ref 4.40–5.90)
RDW: 13.7 % (ref 11.5–14.5)
WBC: 8.4 10*3/uL (ref 3.8–10.6)

## 2018-06-16 LAB — LIPASE, BLOOD: Lipase: 25 U/L (ref 11–51)

## 2018-06-16 NOTE — ED Provider Notes (Signed)
[facility] Emergency Department Provider Note  ____________________________________________   First MD Initiated Contact with Patient 06/16/18 1132     (approximate)  I have reviewed the triage vital signs and the nursing notes.   HISTORY  Chief Complaint Emesis  HPI Juan Cook is a 27 y.o. male is here with complaint of vomiting yesterday.  Patient states that he vomited approximately 5 times.  He denies any diarrhea.  Last vomiting was at 7 PM, he has been drinking fluids last evening and this morning without any continued vomiting.  He denies any fever or chills.  No one else in his family has had similar symptoms.  He states he came to the emergency department to make sure everything was okay with his body and to get a work note.  He rates his pain as a 0/10.   History reviewed. No pertinent past medical history.  There are no active problems to display for this patient.   History reviewed. No pertinent surgical history.  Prior to Admission medications   Not on File    Allergies Patient has no known allergies.  No family history on file.  Social History Social History   Tobacco Use  . Smoking status: Never Smoker  . Smokeless tobacco: Never Used  Substance Use Topics  . Alcohol use: No  . Drug use: Not on file    Review of Systems Constitutional: No fever/chills ENT: No sore throat. Cardiovascular: Denies chest pain. Respiratory: Denies shortness of breath. Gastrointestinal: No abdominal pain.  No nausea, positive vomiting.  No diarrhea.  Genitourinary: Negative for dysuria. Musculoskeletal: Negative for muscle aches. Skin: Negative for rash. Neurological: Negative for headaches, focal weakness or numbness. ____________________________________________   PHYSICAL EXAM:  VITAL SIGNS: ED Triage Vitals  Enc Vitals Group     BP 06/16/18 1059 126/71     Pulse Rate 06/16/18 1059 (!) 51     Resp 06/16/18 1059 18     Temp  06/16/18 1059 98.6 F (37 C)     Temp Source 06/16/18 1059 Oral     SpO2 06/16/18 1059 99 %     Weight 06/16/18 1058 163 lb (73.9 kg)     Height 06/16/18 1058 5\' 8"  (1.727 m)     Head Circumference --      Peak Flow --      Pain Score 06/16/18 1106 0     Pain Loc --      Pain Edu? --      Excl. in GC? --    Constitutional: Alert and oriented. Well appearing and in no acute distress. Eyes: Conjunctivae are normal.  Head: Atraumatic. Nose: No congestion/rhinnorhea. Mouth/Throat: Mucous membranes are moist.  Oropharynx non-erythematous. Neck: No stridor.   Hematological/Lymphatic/Immunilogical: No cervical lymphadenopathy. Cardiovascular: Normal rate, regular rhythm. Grossly normal heart sounds.  Good peripheral circulation. Respiratory: Normal respiratory effort.  No retractions. Lungs CTAB. Gastrointestinal: Soft and nontender. No distention.  Bowel sounds are normoactive x4 quadrants.  No CVA tenderness. Musculoskeletal: No lower extremity tenderness nor edema.  No joint effusions. Neurologic:  Normal speech and language. No gross focal neurologic deficits are appreciated. No gait instability. Skin:  Skin is warm, dry and intact. No rash noted. Psychiatric: Mood and affect are normal. Speech and behavior are normal.  ____________________________________________   LABS (all labs ordered are listed, but only abnormal results are displayed)  Labs Reviewed  COMPREHENSIVE METABOLIC PANEL - Abnormal; Notable for the following components:      Result  Value   Calcium 8.8 (*)    Total Protein 5.8 (*)    Albumin 3.4 (*)    Anion gap 4 (*)    All other components within normal limits  URINALYSIS, COMPLETE (UACMP) WITH MICROSCOPIC - Abnormal; Notable for the following components:   Color, Urine YELLOW (*)    APPearance CLEAR (*)    Ketones, ur 20 (*)    All other components within normal limits  LIPASE, BLOOD  CBC   ____________________________________________  EKG  Per Dr.  Derrill KayGoodman   PROCEDURES  Procedure(s) performed: None  Procedures  Critical Care performed: No  ____________________________________________   INITIAL IMPRESSION / ASSESSMENT AND PLAN / ED COURSE  As part of my medical decision making, I reviewed the following data within the electronic MEDICAL RECORD NUMBER Notes from prior ED visits and [place] Controlled Substance Database  Lab work was reassuring and results was discussed with patient.  There was no continued nausea or vomiting while in the department.  Patient was encouraged to stay with clear liquids for the next 24 hours.  He stated he needs a note for work as he did not go today.  Patient was also given a list of clinics so that he may establish primary care in the future.  He is aware that this gastroenteritis is a viral illness and is contagious. ____________________________________________   FINAL CLINICAL IMPRESSION(S) / ED DIAGNOSES  Final diagnoses:  Non-intractable vomiting with nausea, unspecified vomiting type     ED Discharge Orders    None       Note:  This document was prepared using Dragon voice recognition software and may include unintentional dictation errors.    Tommi RumpsSummers, Tammi Boulier L, PA-C 06/16/18 1507    Phineas SemenGoodman, [place], MD 06/17/18 [phone]

## 2018-06-16 NOTE — ED Triage Notes (Signed)
Vomiting x 1 day.  Also c/o feeling weak today.  AAOx3.  Skin warm and dry. NAD

## 2018-06-16 NOTE — Discharge Instructions (Addendum)
Call 1 of the clinics listed on your discharge papers to establish primary care.  Clear liquids for the next 24 hours.  Tylenol if needed.  As you start to eat choose light foods such as crackers, toast, rice and applesauce.  Avoid milk products for at least 72 hours as this may cause gas.  Avoid greasy foods, fried foods or foods high in fat at this time.

## 2018-06-16 NOTE — ED Provider Notes (Signed)
Lurline IdolI, Huong Luthi, attending physician, personally viewed and interpreted this EKG  EKG Time: 1059 Rate: 46 Rhythm: sinus bradycardia Axis: normal Intervals: qtc 399 QRS: LVH ST changes: no st elevation Impression: abnormal ekg    Phineas SemenGoodman, Jolynn Bajorek, MD 06/16/18 1159

## 2018-06-16 NOTE — ED Notes (Signed)
Pt reports he vomited yesterday x3 and then today he feels weak - he states he just wants to be checked out and get a doctor note for work

## 2019-10-27 ENCOUNTER — Ambulatory Visit: Payer: Self-pay

## 2020-07-17 ENCOUNTER — Ambulatory Visit: Payer: Self-pay | Attending: Internal Medicine

## 2020-07-17 DIAGNOSIS — Z23 Encounter for immunization: Secondary | ICD-10-CM

## 2020-07-17 NOTE — Progress Notes (Signed)
   Covid-19 Vaccination Clinic  Name:  Juan Cook    MRN: [id number] DOB: [birth year]  07/17/2020  [name] was observed post Covid-19 immunization for 15 minutes without incident. He was provided with Vaccine Information Sheet and instruction to access the V-Safe system.   [name] was instructed to call 911 with any severe reactions post vaccine: Marland Kitchen Difficulty breathing  . Swelling of face and throat  . A fast heartbeat  . A bad rash all over body  . Dizziness and weakness   Immunizations Administered    Name Date Dose VIS Date Route   Pfizer COVID-19 Vaccine 07/17/2020  4:56 PM 0.3 mL 01/19/2019 Intramuscular   Manufacturer: [employer], [employer]   Lot: [id number]   NDC: [address]-0

## 2020-08-07 ENCOUNTER — Ambulatory Visit: Payer: Self-pay

## 2020-09-18 ENCOUNTER — Emergency Department
Admission: EM | Admit: 2020-09-18 | Discharge: 2020-09-18 | Disposition: A | Discharge status: Home / Self Care | Payer: Self-pay | Attending: Emergency Medicine | Admitting: Emergency Medicine

## 2020-09-18 ENCOUNTER — Other Ambulatory Visit: Payer: Self-pay

## 2020-09-18 ENCOUNTER — Encounter: Payer: Self-pay | Admitting: Emergency Medicine

## 2020-09-18 DIAGNOSIS — S025XXA Fracture of tooth (traumatic), initial encounter for closed fracture: Secondary | ICD-10-CM

## 2020-09-18 DIAGNOSIS — K029 Dental caries, unspecified: Secondary | ICD-10-CM | POA: Insufficient documentation

## 2020-09-18 DIAGNOSIS — K0889 Other specified disorders of teeth and supporting structures: Secondary | ICD-10-CM

## 2020-09-18 DIAGNOSIS — K0381 Cracked tooth: Secondary | ICD-10-CM | POA: Insufficient documentation

## 2020-09-18 MED ORDER — IBUPROFEN 400 MG PO TABS
600.0000 mg | ORAL_TABLET | Freq: Once | ORAL | Status: AC
  Administered 2020-09-18: 600 mg via ORAL
  Filled 2020-09-18: qty 2

## 2020-09-18 MED ORDER — LIDOCAINE VISCOUS HCL 2 % MT SOLN
15.0000 mL | Freq: Once | OROMUCOSAL | Status: AC
  Administered 2020-09-18: 15 mL via OROMUCOSAL
  Filled 2020-09-18: qty 15

## 2020-09-18 MED ORDER — OXYCODONE-ACETAMINOPHEN 5-325 MG PO TABS: 1.0000 | ORAL_TABLET | ORAL | 0 refills | Status: DC | PRN

## 2020-09-18 MED ORDER — OXYCODONE-ACETAMINOPHEN 5-325 MG PO TABS
1.0000 | ORAL_TABLET | Freq: Once | ORAL | Status: AC
  Administered 2020-09-18: 1 via ORAL
  Filled 2020-09-18: qty 1

## 2020-09-18 MED ORDER — AMOXICILLIN 500 MG PO CAPS: 500.0000 mg | ORAL_CAPSULE | Freq: Three times a day (TID) | ORAL | 0 refills | Status: DC

## 2020-09-18 NOTE — Discharge Instructions (Signed)
1.  Take antibiotic as prescribed (Amoxicillin 500mg  3 times daily x7 days). 2.  You may take Ibuprofen as needed for pain; Percocet as needed for more severe pain. 3.  Return to the ER for worsening symptoms, persistent vomiting, difficulty breathing or other concerns.

## 2020-09-18 NOTE — ED Provider Notes (Signed)
[facility] Emergency Department Provider Note   ____________________________________________   First MD Initiated Contact with Patient 09/18/20 0110     (approximate)  I have reviewed the triage vital signs and the nursing notes.   HISTORY  Chief Complaint Dental Pain    HPI Juan Cook is a 29 y.o. male who presents to the ED from home with a chief complaint of dentalgia.  Patient reports pre-existing molar fractures times several months.  Experiencing left-sided lower > upper dental pain x1 week.  Tried BC powder and Orajel without relief.  Denies fever, swelling, vomiting.     Past medical history None  There are no problems to display for this patient.   History reviewed. No pertinent surgical history.  Prior to Admission medications   Medication Sig Start Date End Date Taking? Authorizing Provider  amoxicillin (AMOXIL) 500 MG capsule Take 1 capsule (500 mg total) by mouth 3 (three) times daily. 09/18/20   Irean Hong, MD  oxyCODONE-acetaminophen (PERCOCET/ROXICET) 5-325 MG tablet Take 1 tablet by mouth every 4 (four) hours as needed for severe pain. 09/18/20   Irean Hong, MD    Allergies Patient has no known allergies.  History reviewed. No pertinent family history.  Social History Social History   Tobacco Use  . Smoking status: Never Smoker  . Smokeless tobacco: Never Used  Substance Use Topics  . Alcohol use: No  . Drug use: Never    Review of Systems  Constitutional: No fever/chills Eyes: No visual changes. ENT: Positive for dentalgia.  No sore throat. Cardiovascular: Denies chest pain. Respiratory: Denies shortness of breath. Gastrointestinal: No abdominal pain.  No nausea, no vomiting.  No diarrhea.  No constipation. Genitourinary: Negative for dysuria. Musculoskeletal: Negative for back pain. Skin: Negative for rash. Neurological: Negative for headaches, focal weakness or  numbness.   ____________________________________________   PHYSICAL EXAM:  VITAL SIGNS: ED Triage Vitals  Enc Vitals Group     BP 09/18/20 0049 (!) 151/79     Pulse Rate 09/18/20 0049 73     Resp 09/18/20 0049 18     Temp 09/18/20 0049 99.6 F (37.6 C)     Temp Source 09/18/20 0049 Oral     SpO2 09/18/20 0049 98 %     Weight 09/18/20 0049 158 lb (71.7 kg)     Height 09/18/20 0049 5\' 4"  (1.626 m)     Head Circumference --      Peak Flow --      Pain Score 09/18/20 0054 10     Pain Loc --      Pain Edu? --      Excl. in [place]? --     Constitutional: Alert and oriented. Well appearing and in mild acute distress. Eyes: Conjunctivae are normal. PERRL. EOMI. Head: Atraumatic. Nose: No congestion/rhinnorhea. Mouth/Throat: Mucous membranes are moist.  Widespread dental caries.  Pre-existing crack to left upper posterior molar.  Bottom two most posterior left molars broken off at the gumline with caries.  No surrounding abscess.  No intraoral or extraoral swelling. Neck: No stridor.   Cardiovascular: Normal rate, regular rhythm. Grossly normal heart sounds.  Good peripheral circulation. Respiratory: Normal respiratory effort.  No retractions. Lungs CTAB. Gastrointestinal: Soft and nontender. No distention. No abdominal bruits. No CVA tenderness. Musculoskeletal: No lower extremity tenderness nor edema.  No joint effusions. Neurologic:  Normal speech and language. No gross focal neurologic deficits are appreciated. No gait instability. Skin:  Skin is warm, dry and  intact. No rash noted. Psychiatric: Mood and affect are normal. Speech and behavior are normal.  ____________________________________________   LABS (all labs ordered are listed, but only abnormal results are displayed)  Labs Reviewed - No data to display ____________________________________________  EKG  None ____________________________________________  RADIOLOGY I, Sophy Mesler J, personally viewed and evaluated  these images (plain radiographs) as part of my medical decision making, as well as reviewing the written report by the radiologist.  ED MD interpretation: None  Official radiology report(s): No results found.  ____________________________________________   PROCEDURES  Procedure(s) performed (including Critical Care):  Procedures   ____________________________________________   INITIAL IMPRESSION / ASSESSMENT AND PLAN / ED COURSE  As part of my medical decision making, I reviewed the following data within the electronic MEDICAL RECORD NUMBER Nursing notes reviewed and incorporated, Old chart reviewed, Notes from prior ED visits (for minor care issues) and [place] Controlled Substance Database     29 year old male presenting with dentalgia secondary to pre-existing cracked teeth.  Will start amoxicillin, lidocaine rinse, ibuprofen and Percocet.  Patient given list of dental clinics for follow-up.  Strict return precautions given.  Patient verbalizes understanding agrees with plan of care.      ____________________________________________   FINAL CLINICAL IMPRESSION(S) / ED DIAGNOSES  Final diagnoses:  Toothache  Closed fracture of tooth, initial encounter  Dental caries     ED Discharge Orders         Ordered    amoxicillin (AMOXIL) 500 MG capsule  3 times daily        09/18/20 0111    oxyCODONE-acetaminophen (PERCOCET/ROXICET) 5-325 MG tablet  Every 4 hours PRN        09/18/20 0111          *Please note:  [name] was evaluated in Emergency Department on 09/18/2020 for the symptoms described in the history of present illness. He was evaluated in the context of the global COVID-19 pandemic, which necessitated consideration that the patient might be at risk for infection with the SARS-CoV-2 virus that causes COVID-19. Institutional protocols and algorithms that pertain to the evaluation of patients at risk for COVID-19 are in a state of rapid change based on  information released by regulatory bodies including the CDC and federal and state organizations. These policies and algorithms were followed during the patient's care in the ED.  Some ED evaluations and interventions may be delayed as a result of limited staffing during and the pandemic.*   Note:  This document was prepared using Dragon voice recognition software and may include unintentional dictation errors.   Irean Hong, MD 09/18/20 [phone]

## 2020-09-18 NOTE — ED Triage Notes (Signed)
Pt to ED from home c/o upper and lower left dental pain x1 week and getting worse.  Using [facility] powder and oral gel, now without relief.  Pt having difficulty staying still d/t pain in triage.  A&Ox4, chest rise even and unlabored, speaking complete and coherent sentences.

## 2020-12-04 ENCOUNTER — Ambulatory Visit
Admission: RE | Admit: 2020-12-04 | Discharge: 2020-12-04 | Disposition: A | Discharge status: Home / Self Care | Payer: Self-pay | Source: Ambulatory Visit | Attending: Chiropractor | Admitting: Chiropractor

## 2020-12-04 ENCOUNTER — Other Ambulatory Visit: Payer: Self-pay | Admitting: Chiropractor

## 2020-12-04 DIAGNOSIS — Y929 Unspecified place or not applicable: Secondary | ICD-10-CM | POA: Insufficient documentation

## 2020-12-04 DIAGNOSIS — M545 Low back pain, unspecified: Secondary | ICD-10-CM | POA: Insufficient documentation

## 2020-12-04 DIAGNOSIS — Y939 Activity, unspecified: Secondary | ICD-10-CM | POA: Insufficient documentation

## 2022-02-02 ENCOUNTER — Emergency Department
Admission: EM | Admit: 2022-02-02 | Discharge: 2022-02-02 | Disposition: A | Discharge status: Home / Self Care | Payer: Self-pay | Attending: Emergency Medicine | Admitting: Emergency Medicine

## 2022-02-02 ENCOUNTER — Other Ambulatory Visit: Payer: Self-pay

## 2022-02-02 ENCOUNTER — Encounter: Payer: Self-pay | Admitting: Emergency Medicine

## 2022-02-02 DIAGNOSIS — K0889 Other specified disorders of teeth and supporting structures: Secondary | ICD-10-CM | POA: Insufficient documentation

## 2022-02-02 MED ORDER — LIDOCAINE-EPINEPHRINE 2 %-1:100000 IJ SOLN
1.7000 mL | Freq: Once | INTRAMUSCULAR | Status: DC
  Filled 2022-02-02: qty 1.7

## 2022-02-02 MED ORDER — HYDROCODONE-ACETAMINOPHEN 5-325 MG PO TABS: 1.0000 | ORAL_TABLET | Freq: Four times a day (QID) | ORAL | 0 refills | Status: AC | PRN

## 2022-02-02 MED ORDER — AMOXICILLIN 500 MG PO TABS: 500.0000 mg | ORAL_TABLET | Freq: Three times a day (TID) | ORAL | 0 refills | Status: DC

## 2022-02-02 NOTE — ED Triage Notes (Signed)
Pt reports toothache to right jaw for 2 days. Pt reports pain is severe and he can't even sleep. ?

## 2022-02-02 NOTE — Discharge Instructions (Signed)
Please call and schedule a dental appointment as soon as possible. You will need to be seen within the next 14 days. Return to the emergency department for symptoms that change or worsen if you're unable to schedule an appointment.  OPTIONS FOR DENTAL FOLLOW UP CARE  [place] Department of Health and Human Services - Local Safety Net Dental Clinics http://www.ncdhhs.gov/dph/oralhealth/services/safetynetclinics.htm   [facility] ([phone])  [facility] ([phone])  [facility] ([phone])  [facility] ([phone])  [facility] ([phone]) This clinic caters to the indigent population and is on a lottery system. Location: [facility], [address], [place] Clinic Hours: Wednesdays from 6pm - 9pm, patients seen by a lottery system. For dates, call or go to [email or web address].[facility].edu/[facility]/patients/Dental-[facility] Services: Cleanings, fillings and simple extractions. Payment Options: DENTAL WORK IS FREE OF CHARGE. Bring proof of income or support. Best way to get seen: Arrive at 5:15 pm - this is a lottery, NOT first come/first serve, so arriving earlier will not increase your chances of being seen.     [facility] [phone] Select option 1 for emergencies   Location: [facility], [address], [place] Clinic Hours: No walk-ins accepted - call the day before to schedule an appointment. Check in times are 9:30 am and 1:30 pm. Services: Simple extractions, temporary fillings, pulpectomy/pulp debridement, uncomplicated abscess drainage. Payment Options: PAYMENT IS DUE AT THE TIME OF SERVICE.  Fee is usually $100-200, additional surgical procedures (e.g. abscess drainage) may be extra. Cash, checks, Visa/MasterCard accepted.  Can file Medicaid if patient is covered for dental - patient should call case worker to check. No  discount for [facility] patients. Best way to get seen: MUST call the day before and get onto the schedule. Can usually be seen the next 1-2 days. No walk-ins accepted.     [facility] [phone]   Location: [facility], [address], [place] Clinic Hours: M, W, Th, F 8am or 1:30pm, Tues 9a or 1:30 - first come/first served. Services: Simple extractions, temporary fillings, uncomplicated abscess drainage.  You do not need to be an [place] resident. Payment Options: PAYMENT IS DUE AT THE TIME OF SERVICE. Dental insurance, otherwise sliding scale - bring proof of income or support. Depending on income and treatment needed, cost is usually $50-200. Best way to get seen: Arrive early as it is first come/first served.     [facility] [phone]   Location: [address] Clinic Hours: Mon-Thu 8a-5p Services: Most basic dental services including extractions and fillings. Payment Options: PAYMENT IS DUE AT THE TIME OF SERVICE. Sliding scale, up to 50% off - bring proof if income or support. Medicaid with dental option accepted. Best way to get seen: Call to schedule an appointment, can usually be seen within 2 weeks OR they will try to see walk-ins - show up at 8a or 2p (you may have to wait).     [facility] [phone] [place] RESIDENTS ONLY   Location: [facility], [address], [place], [place] [address] Clinic Hours: By appointment only. Monday - Thursday 8am-5pm, Friday 8am-12pm Services: Cleanings, fillings, extractions. Payment Options: PAYMENT IS DUE AT THE TIME OF SERVICE. Cash, Visa or MasterCard. Sliding scale - $30 minimum per service. Best way to get seen: Come in to office, complete packet and make an appointment -   need proof of income or support monies for each household member and proof of [place]  residence. Usually takes about a month to get in.     [facility] [phone]   Location: [address] [place] Clinic Hours: Walk-in Urgent Care Dental Services are offered Monday-Friday mornings only. The numbers of emergencies accepted daily is limited to the number of providers available. Maximum 15 - Mondays, Wednesdays & Thursdays Maximum 10 - Tuesdays & Fridays Services: You do not need to be a [place] resident to be seen for a dental emergency. Emergencies are defined as pain, swelling, abnormal bleeding, or dental trauma. Walkins will receive x-rays if needed. NOTE: Dental cleaning is not an emergency. Payment Options: PAYMENT IS DUE AT THE TIME OF SERVICE. Minimum co-pay is $40.00 for uninsured patients. Minimum co-pay is $3.00 for Medicaid with dental coverage. Dental Insurance is accepted and must be presented at time of visit. Medicare does not cover dental. Forms of payment: Cash, credit card, checks. Best way to get seen: If not previously registered with the clinic, walk-in dental registration begins at 7:15 am and is on a first come/first serve basis. If previously registered with the clinic, call to make an appointment.     [facility] [phone] [place] RESIDENTS ONLY   Location: [address], [place],  Clinic Hours: Mon-Thu 10a-2p Services: Extractions only! Payment Options: FREE (donations accepted) - bring proof of income or support Best way to get seen: Call and schedule an appointment OR come at 8am on the 1st Monday of every month (except for holidays) when it is first come/first served.     [facility] [phone]   Location: [address], [place] Clinic Hours: Friday mornings Services, Payment Options, Best way to get seen: Call for info  

## 2022-02-02 NOTE — ED Provider Notes (Signed)
? ?[facility] ?Provider Note ? ? ? Event Date/Time  ? First MD Initiated Contact with Patient 02/02/22 [phone]   ?  (approximate) ? ? ?History  ? ?Dental Pain ? ? ?HPI ? ?Juan Cook is a 31 y.o. male with no chronic medical history and as listed in EMR presents to the emergency department for treatment and evaluation of dental pain to the right lower jaw for the past 2 days. No relief with BC Powder. ? ?  ? ? ?Physical Exam  ? ?Triage Vital Signs: ?ED Triage Vitals  ?Enc Vitals Group  ?   BP 02/02/22 0728 (!) 165/145  ?   Pulse Rate 02/02/22 0728 82  ?   Resp 02/02/22 0728 18  ?   Temp 02/02/22 0728 97.8 ?F (36.6 ?C)  ?   Temp Source 02/02/22 0728 Oral  ?   SpO2 02/02/22 0728 98 %  ?   Weight 02/02/22 0724 154 lb (69.9 kg)  ?   Height 02/02/22 0724 5\' 5"  (1.651 m)  ?   Head Circumference --   ?   Peak Flow --   ?   Pain Score 02/02/22 0724 10  ?   Pain Loc --   ?   Pain Edu? --   ?   Excl. in [place]? --   ? ? ?Most recent vital signs: ?Vitals:  ? 02/02/22 0728 02/02/22 0733  ?BP: (!) 165/145 (!) 150/98  ?Pulse: 82   ?Resp: 18   ?Temp: 97.8 ?F (36.6 ?C)   ?SpO2: 98%   ? ? ?General: Awake, no distress.  ?CV:  Good peripheral perfusion.  ?Resp:  Normal effort.  ?Abd:  No distention.  ?Other:  Chronic appearing dental erosion/cavities of molars right lower jaw. No associated facial swelling. ? ? ?ED Results / Procedures / Treatments  ? ?Labs ?(all labs ordered are listed, but only abnormal results are displayed) ?Labs Reviewed - No data to display ? ? ?EKG ? ? ? ? ?RADIOLOGY ? ?Image and radiology report reviewed by me. ? ? ? ?PROCEDURES: ? ?Critical Care performed: No ? ?.Nerve Block ? ?Date/Time: 02/02/2022 9:03 AM ?Performed by: Victorino Dike, FNP ?Authorized by: Victorino Dike, FNP  ? ?Consent:  ?  Consent obtained:  Verbal ?  Consent given by:  Patient ?  Risks discussed:  Pain and unsuccessful block ?Universal protocol:  ?  Patient identity confirmed:  Verbally with patient ?Indications:   ?  Indications:  Pain relief ?Location:  ?  Nerve block body site: Dental. ?Procedure details:  ?  Block needle gauge:  27 G ?  Anesthetic injected:  Lidocaine 2% WITH epi ?  Injection procedure:  Anatomic landmarks identified ?Post-procedure details:  ?  Outcome:  Pain relieved ?  Procedure completion:  Tolerated well, no immediate complications ?Comments:  ?   Inferior alveolar block ? ? ? ?MEDICATIONS ORDERED IN ED: ?Medications  ?lidocaine-EPINEPHrine (XYLOCAINE W/EPI) 2 %-1:100000 (with pres) injection 1.7 mL (has no administration in time range)  ? ? ? ?IMPRESSION / MDM / ASSESSMENT AND PLAN / ED COURSE  ? ?I have reviewed the triage note. ? ?Differential diagnosis includes, but is not limited to, dental infection, dental decay, gingivitis. ? ?31 year old male presenting to the emergency department for treatment and evaluation of dental pain.  See HPI for further details. ? ?Dental block performed with successful anesthesia. ? ?Patient discharged home with prescriptions for antibiotics and pain medication.  He was also provided a list of  dental resources and encouraged to be evaluated within the next 2 weeks.  He is to see primary care or return to the emergency department for symptoms of change or worsen if he is unable to see the dentist right away. ? ?  ? ? ?FINAL CLINICAL IMPRESSION(S) / ED DIAGNOSES  ? ?Final diagnoses:  ?Pain, dental  ? ? ? ?Rx / DC Orders  ? ?ED Discharge Orders   ? ?      Ordered  ?  amoxicillin (AMOXIL) 500 MG tablet  3 times daily       ? 02/02/22 0755  ?  HYDROcodone-acetaminophen (NORCO/VICODIN) 5-325 MG tablet  Every 6 hours PRN       ? 02/02/22 0755  ? ?  ?  ? ?  ? ? ? ?Note:  This document was prepared using Dragon voice recognition software and may include unintentional dictation errors. ?  Victorino Dike, FNP ?02/02/22 [id number] ? ?  ?Duffy Bruce, MD ?02/03/22 0930 ? ?

## 2022-10-01 ENCOUNTER — Encounter: Payer: Self-pay | Admitting: Emergency Medicine

## 2022-10-01 ENCOUNTER — Other Ambulatory Visit: Payer: Self-pay

## 2022-10-01 ENCOUNTER — Emergency Department
Admission: EM | Admit: 2022-10-01 | Discharge: 2022-10-01 | Disposition: A | Discharge status: Home / Self Care | Payer: Self-pay | Attending: Emergency Medicine | Admitting: Emergency Medicine

## 2022-10-01 DIAGNOSIS — M545 Low back pain, unspecified: Secondary | ICD-10-CM

## 2022-10-01 MED ORDER — IBUPROFEN 800 MG PO TABS: 800.0000 mg | ORAL_TABLET | Freq: Three times a day (TID) | ORAL | 0 refills | Status: DC | PRN

## 2022-10-01 MED ORDER — METHOCARBAMOL 500 MG PO TABS: 500.0000 mg | ORAL_TABLET | Freq: Four times a day (QID) | ORAL | 0 refills | Status: DC | PRN

## 2022-10-01 NOTE — Discharge Instructions (Addendum)
Follow-up with your primary care provider if any continued problems or concerns.  Prescriptions were sent to your pharmacy to begin taking today.  The ibuprofen 800 mg is to be taken with food every 8 hours and the methocarbamol is a muscle relaxant and should be taken every 6 hours as needed for muscle spasms.  You may use ice or heat to your back as needed for discomfort.  No sports for 1 to 2 weeks into your back is completely stopped hurting.

## 2022-10-01 NOTE — ED Triage Notes (Signed)
Pt to ER with c/o lower back pain that hurts worse with movement.  States pain for last several days.  No known injury.

## 2022-10-01 NOTE — ED Provider Notes (Signed)
[facility] Provider Note    Event Date/Time   First MD Initiated Contact with Patient 10/01/22 [phone]     (approximate)   History   Back Pain   HPI  HUXTON WAND is a 31 y.o. male   complains of low back pain for the last 2 to 3 days.  He states it was worse this morning when he woke up.  He reports that he did play basketball with his child yesterday but denies any fall or direct injury to his back.  He has taken limited number of over-the-counter medication without any relief.  He denies any urinary symptoms or history of stones.  Patient denies any medical history.      Physical Exam   Triage Vital Signs: ED Triage Vitals  Enc Vitals Group     BP 10/01/22 0930 122/82     Pulse Rate 10/01/22 0930 64     Resp 10/01/22 0930 18     Temp 10/01/22 0930 97.9 F (36.6 C)     Temp Source 10/01/22 0930 Oral     SpO2 10/01/22 0930 100 %     Weight 10/01/22 0931 150 lb (68 kg)     Height 10/01/22 0931 5\' 6"  (1.676 m)     Head Circumference --      Peak Flow --      Pain Score 10/01/22 0930 8     Pain Loc --      Pain Edu? --      Excl. in [place]? --     Most recent vital signs: Vitals:   10/01/22 0930 10/01/22 1026  BP: 122/82 121/79  Pulse: 64 60  Resp: 18 17  Temp: 97.9 F (36.6 C) 98 F (36.7 C)  SpO2: 100% 99%     General: Awake, no distress.  CV:  Good peripheral perfusion.  Resp:  Normal effort.  Abd:  No distention.  Other:  Examination of the back there is no gross deformity.  There is tenderness on palpation of the mid lumbar area bilaterally on palpation of the muscles.  Range of motion is slow and guarded secondary to discomfort.  Patient is able to stand and is ambulatory without any assistance.  Straight leg raises were negative and reflexes were 2+ bilaterally.  Normal gait was noted.   ED Results / Procedures / Treatments   Labs (all labs ordered are listed, but only abnormal results are displayed) Labs Reviewed - No  data to display    PROCEDURES:  Critical Care performed:   Procedures   MEDICATIONS ORDERED IN ED: Medications - No data to display   IMPRESSION / MDM / [place] / ED COURSE  I reviewed the triage vital signs and the nursing notes.   Differential diagnosis includes, but is not limited to, lumbar strain, lumbar pain, compression fracture.  31 year old male presents to the ED with complaint of low back pain for several days that is exacerbated after playing basketball yesterday.  Patient denies any direct trauma to his back.  We discussed an injection of Toradol while in the ED and patient did not want to get a shot.  He is willing to take pills therefore prescription for methocarbamol was sent to the pharmacy along with ibuprofen 800 mg 3 times daily with food.  Patient is encouraged to use ice or heat to his back as needed for discomfort and avoid sports until his back is completely well.      Patient's  presentation is most consistent with acute complicated illness / injury requiring diagnostic workup.  FINAL CLINICAL IMPRESSION(S) / ED DIAGNOSES   Final diagnoses:  Lumbar pain     Rx / DC Orders   ED Discharge Orders          Ordered    methocarbamol (ROBAXIN) 500 MG tablet  Every 6 hours PRN        10/01/22 1015    ibuprofen (ADVIL) 800 MG tablet  Every 8 hours PRN        10/01/22 1015             Note:  This document was prepared using Dragon voice recognition software and may include unintentional dictation errors.   Johnn Hai, PA-C 10/01/22 1202    Harvest Dark, MD 10/01/22 1504

## 2022-11-04 ENCOUNTER — Emergency Department
Admission: EM | Admit: 2022-11-04 | Discharge: 2022-11-04 | Disposition: A | Discharge status: Home / Self Care | Payer: Self-pay | Attending: Emergency Medicine | Admitting: Emergency Medicine

## 2022-11-04 ENCOUNTER — Encounter: Payer: Self-pay | Admitting: Emergency Medicine

## 2022-11-04 ENCOUNTER — Other Ambulatory Visit: Payer: Self-pay

## 2022-11-04 DIAGNOSIS — M25512 Pain in left shoulder: Secondary | ICD-10-CM | POA: Insufficient documentation

## 2022-11-04 DIAGNOSIS — M545 Low back pain, unspecified: Secondary | ICD-10-CM | POA: Insufficient documentation

## 2022-11-04 MED ORDER — MELOXICAM 15 MG PO TABS: 15.0000 mg | ORAL_TABLET | Freq: Every day | ORAL | 1 refills | Status: AC

## 2022-11-04 MED ORDER — METHOCARBAMOL 500 MG PO TABS: 500.0000 mg | ORAL_TABLET | Freq: Three times a day (TID) | ORAL | 0 refills | Status: AC | PRN

## 2022-11-04 NOTE — ED Triage Notes (Signed)
Presents with pain to left shoulder for about 2 weeks  Developed pain after playing b/b

## 2022-11-04 NOTE — ED Provider Notes (Signed)
[facility] Provider Note  Patient Contact: 4:12 PM (approximate)   History   Shoulder Pain   HPI  Juan Cook is a 31 y.o. male presents to the emergency department with some left shoulder pain and low back pain after playing basketball for several hours.  Patient denies falls or mechanisms of trauma.  No chest pain, chest tightness or shortness of breath.  No dysuria, hematuria or increased urinary frequency.  No nausea, vomiting or fever.      Physical Exam   Triage Vital Signs: ED Triage Vitals  Enc Vitals Group     BP 11/04/22 1537 126/73     Pulse Rate 11/04/22 1537 (!) 54     Resp 11/04/22 1537 16     Temp 11/04/22 1537 97.8 F (36.6 C)     Temp Source 11/04/22 1537 Oral     SpO2 11/04/22 1537 97 %     Weight 11/04/22 1543 150 lb (68 kg)     Height 11/04/22 1543 5\' 7"  (1.702 m)     Head Circumference --      Peak Flow --      Pain Score 11/04/22 1543 9     Pain Loc --      Pain Edu? --      Excl. in GC? --     Most recent vital signs: Vitals:   11/04/22 1537  BP: 126/73  Pulse: (!) 54  Resp: 16  Temp: 97.8 F (36.6 C)  SpO2: 97%     General: Alert and in no acute distress. Eyes:  PERRL. EOMI. Head: No acute traumatic findings ENT:      Nose: No congestion/rhinnorhea.      Mouth/Throat: Mucous membranes are moist. Neck: No stridor. No cervical spine tenderness to palpation. Cardiovascular:  Good peripheral perfusion Respiratory: Normal respiratory effort without tachypnea or retractions. Lungs CTAB. Good air entry to the bases with no decreased or absent breath sounds. Gastrointestinal: Bowel sounds 4 quadrants. Soft and nontender to palpation. No guarding or rigidity. No palpable masses. No distention. No CVA tenderness. Musculoskeletal: Full range of motion to all extremities.  Patient has some paraspinal muscle tenderness along the lumbar spine. Neurologic:  No gross focal neurologic deficits are appreciated.   Skin:   No rash noted Other:   ED Results / Procedures / Treatments   Labs (all labs ordered are listed, but only abnormal results are displayed) Labs Reviewed - No data to display     PROCEDURES:  Critical Care performed: No  Procedures   MEDICATIONS ORDERED IN ED: Medications - No data to display   IMPRESSION / MDM / ASSESSMENT AND PLAN / ED COURSE  I reviewed the triage vital signs and the nursing notes.                              Assessment and plan Musculoskeletal pain 31 year old male presents to the emergency department with nonspecific left shoulder pain and low back pain after playing basketball.  Patient also requested a work note.  He was discharged with meloxicam and Robaxin.  Return precautions were given to return with new or worsening symptoms.   FINAL CLINICAL IMPRESSION(S) / ED DIAGNOSES   Final diagnoses:  Acute pain of left shoulder     Rx / DC Orders   ED Discharge Orders          Ordered    methocarbamol (ROBAXIN) 500 MG  tablet  Every 8 hours PRN        11/04/22 1554    meloxicam (MOBIC) 15 MG tablet  Daily        11/04/22 1554             Note:  This document was prepared using Dragon voice recognition software and may include unintentional dictation errors.   Vallarie Mare Sloan, PA-C 11/04/22 1613    Carrie Mew, MD 11/04/22 2308

## 2022-11-04 NOTE — Discharge Instructions (Addendum)
Take Meloxicam and Robaxin as directed.  

## 2022-12-26 IMAGING — CR DG LUMBAR SPINE 2-3V
1 series · 2 of 2 positions shown · non-contrast
Comparison: None.

CLINICAL DATA: Pain

EXAM:
THORACIC SPINE 2 VIEWS; LUMBAR SPINE - 2-3 VIEW

[Series 1: dg lumbar spine 2-3 views · 0.14mm/px · 2 of 2 slices shown]
[im 1/2]
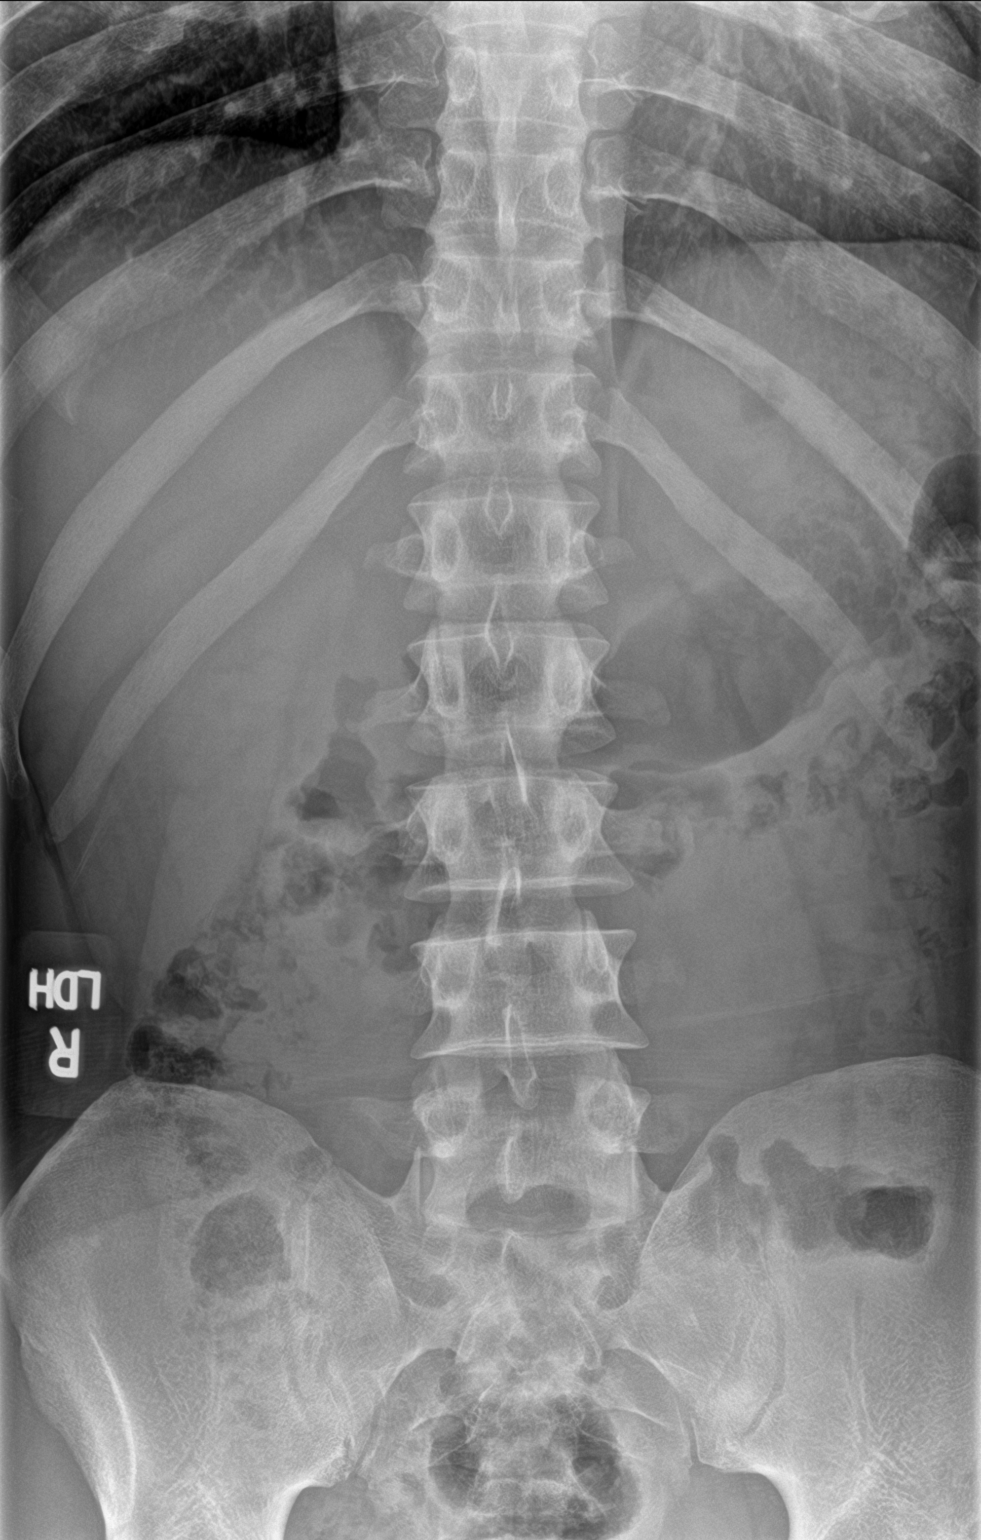
[im 2/2]
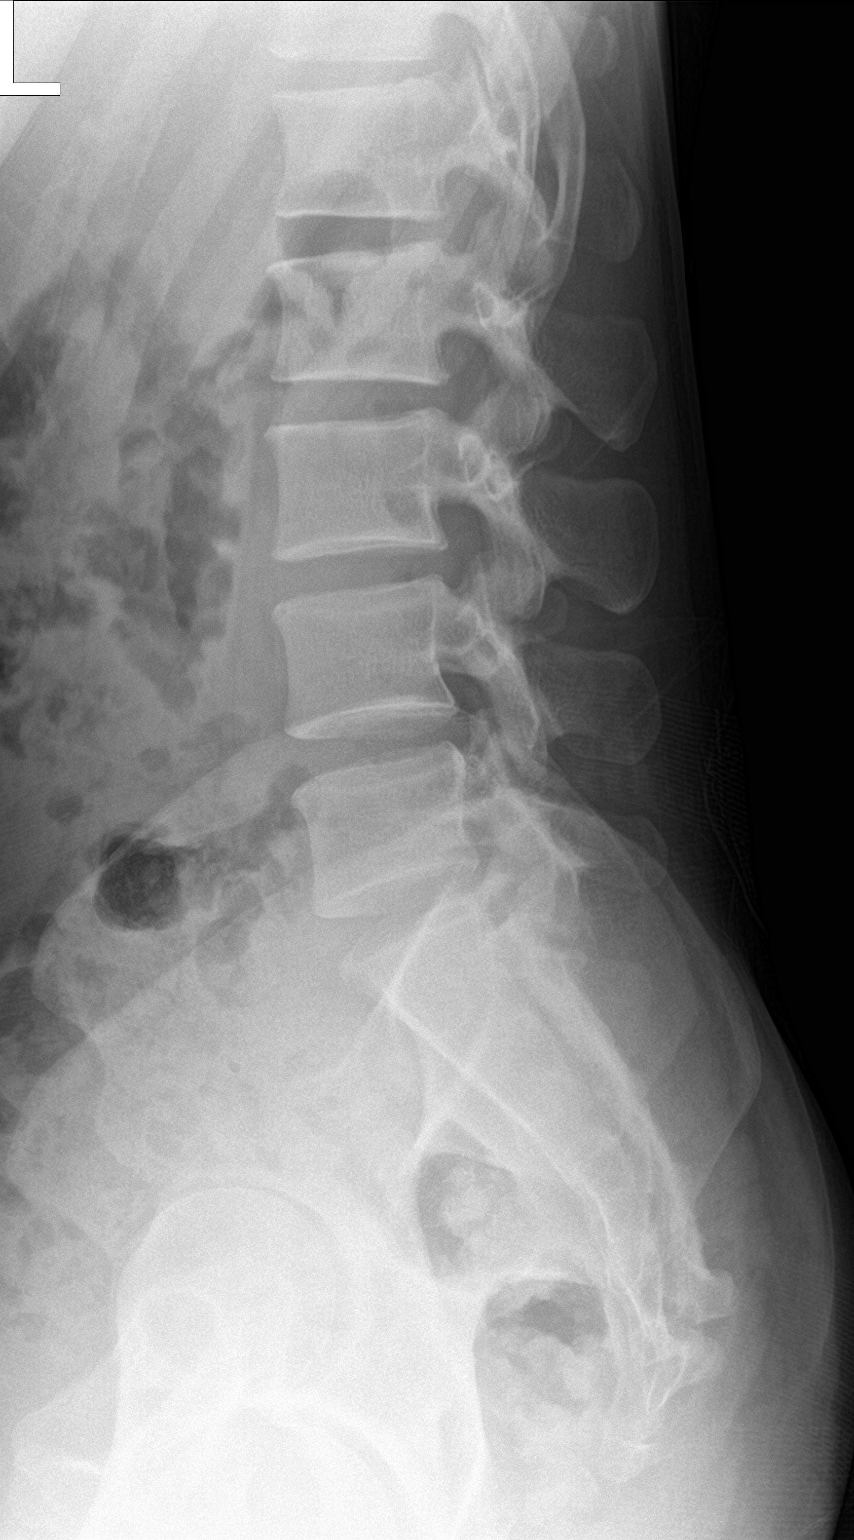

[2 of 2 positions shown; findings below may reference images not displayed]

FINDINGS: There is no evidence of thoracic spine fracture. There is no acute
osseous abnormality involving the lumbar spine. Alignment is normal.
No other significant bone abnormalities are identified. Visualized
bowel gas pattern is unremarkable. There are no definite radiopaque
kidney stones.
IMPRESSION: No acute osseous abnormality involving the thoracic or lumbar spine.

## 2023-07-24 ENCOUNTER — Emergency Department: Payer: 59

## 2023-07-24 ENCOUNTER — Emergency Department
Admission: EM | Admit: 2023-07-24 | Discharge: 2023-07-24 | Disposition: A | Discharge status: Home / Self Care | Payer: 59 | Attending: Emergency Medicine | Admitting: Emergency Medicine

## 2023-07-24 ENCOUNTER — Other Ambulatory Visit: Payer: Self-pay

## 2023-07-24 DIAGNOSIS — X509XXA Other and unspecified overexertion or strenuous movements or postures, initial encounter: Secondary | ICD-10-CM | POA: Insufficient documentation

## 2023-07-24 DIAGNOSIS — M25571 Pain in right ankle and joints of right foot: Secondary | ICD-10-CM | POA: Diagnosis not present

## 2023-07-24 DIAGNOSIS — Y9367 Activity, basketball: Secondary | ICD-10-CM | POA: Diagnosis not present

## 2023-07-24 MED ORDER — IBUPROFEN 800 MG PO TABS
800.0000 mg | ORAL_TABLET | Freq: Once | ORAL | Status: AC
  Administered 2023-07-24: 800 mg via ORAL
  Filled 2023-07-24: qty 1

## 2023-07-24 MED ORDER — IBUPROFEN 800 MG PO TABS: 800.0000 mg | ORAL_TABLET | Freq: Three times a day (TID) | ORAL | 0 refills | Status: DC | PRN

## 2023-07-24 NOTE — ED Provider Notes (Signed)
[facility] Emergency Department Provider Note     Event Date/Time   First MD Initiated Contact with Patient 07/24/23 1551     (approximate)   History   Ankle Pain   HPI  Juan Cook is a 32 y.o. male no significant past medical history presents to the ED for right ankle pain following playing basketball.  Patient reports he planted his foot and made a quick change in direction and heard a pop to his right ankle.  Denies numbness.  Pain score 7/10.  Patient is ambulatory but states pain is worse with weightbearing.     Physical Exam   Triage Vital Signs: ED Triage Vitals [07/24/23 1544]  Encounter Vitals Group     BP (!) 122/91     Systolic BP Percentile      Diastolic BP Percentile      Pulse Rate 93     Resp 18     Temp 97.8 F (36.6 C)     Temp src      SpO2 96 %     Weight 160 lb (72.6 kg)     Height 5\' 7"  (1.702 m)     Head Circumference      Peak Flow      Pain Score 8     Pain Loc      Pain Education      Exclude from Growth Chart     Most recent vital signs: Vitals:   07/24/23 1544  BP: (!) 122/91  Pulse: 93  Resp: 18  Temp: 97.8 F (36.6 C)  SpO2: 96%    General Awake, no distress.  HEENT NCAT. PERRL. EOMI. No rhinorrhea.  CV:  Good peripheral perfusion.  RESP:  Normal effort.  ABD:  No distention.  Other:  Right ankle reveals no deformity, full range of motion.  Tenderness over Achilles tendon.  (-) Thompson's test.  ED Results / Procedures / Treatments   Labs (all labs ordered are listed, but only abnormal results are displayed) Labs Reviewed - No data to display  RADIOLOGY  I personally viewed and evaluated these images as part of my medical decision making, as well as reviewing the written report by the radiologist.  ED Provider Interpretation: Ankle x-ray is unremarkable.  DG Ankle Complete Right  Result Date: 07/24/2023 CLINICAL DATA:  injury while playing basketball. Patient heard a pop. EXAM:  RIGHT ANKLE - COMPLETE 3+ VIEW COMPARISON:  None Available. FINDINGS: No acute fracture or dislocation. No aggressive osseous lesion. Ankle mortise appears intact. No significant arthritis. No focal soft tissue swelling. No radiopaque foreign bodies. IMPRESSION: 1. Negative. Electronically Signed   By: Jules Schick M.D.   On: 07/24/2023 16:09    PROCEDURES:  Critical Care performed: No  Procedures   MEDICATIONS ORDERED IN ED: Medications  ibuprofen (ADVIL) tablet 800 mg (800 mg Oral Given 07/24/23 1634)     IMPRESSION / MDM / ASSESSMENT AND PLAN / ED COURSE  I reviewed the triage vital signs and the nursing notes.                                 32 y.o. male presents to the emergency department for evaluation and treatment of acute ankle.. See HPI for further details.   Differential diagnosis includes, but is not limited to fracture, sprain, dislocation, Achilles tendinitis, Achilles rupture.  Patient's presentation is most consistent with acute complicated  illness / injury requiring diagnostic workup.  Patient's presentation is clinically consistent with Achilles tendinitis given physical exam findings stated above.  Negative Thompson test rules out Achilles rupture, but given focal tenderness to the Achilles tendon I do suspect tendinitis.  Patient is ambulatory but weightbearing exacerbates the pain.  Patient is placed in a cam boot and is instructed to bear weight as tolerated.  Patient opt out of crutches.  I believe this was the next best option for patient's preference.  Patient is to follow-up with orthopedics if symptoms do not improve encouraged ibuprofen and RICE therapy. Patient is given ED precautions to return to the ED for any worsening or new symptoms. Patient verbalizes understanding. All questions and concerns were addressed during ED visit.     FINAL CLINICAL IMPRESSION(S) / ED DIAGNOSES   Final diagnoses:  Acute right ankle pain     Rx / DC Orders   ED  Discharge Orders          Ordered    ibuprofen (ADVIL) 800 MG tablet  Every 8 hours PRN        07/24/23 1648             Note:  This document was prepared using Dragon voice recognition software and may include unintentional dictation errors.    Romeo Apple, Felis Quillin A, PA-C 07/24/23 2223    Chesley Noon, MD 07/25/23 [phone]

## 2023-07-24 NOTE — ED Triage Notes (Signed)
Pt to ED for right ankle injury today while playing basketball, reports heard a pop. No obvious deformity noted.

## 2023-07-24 NOTE — Discharge Instructions (Addendum)
Your evaluated in the ED for right ankle pain.  Your x-rays were normal.  Ibuprofen for pain.  Get plenty of rest.  Use Ace wrap for compression while at home and apply ice.  Keep foot elevated at night with 2 pillows.  Follow-up with orthopedics if pain does not improve.

## 2023-07-27 ENCOUNTER — Encounter: Payer: Self-pay | Admitting: Intensive Care

## 2023-07-27 ENCOUNTER — Other Ambulatory Visit: Payer: Self-pay

## 2023-07-27 ENCOUNTER — Emergency Department
Admission: EM | Admit: 2023-07-27 | Discharge: 2023-07-27 | Disposition: A | Discharge status: Home / Self Care | Payer: 59 | Attending: Student in an Organized Health Care Education/Training Program | Admitting: Student in an Organized Health Care Education/Training Program

## 2023-07-27 DIAGNOSIS — M25571 Pain in right ankle and joints of right foot: Secondary | ICD-10-CM | POA: Diagnosis present

## 2023-07-27 MED ORDER — IBUPROFEN 800 MG PO TABS: 800.0000 mg | ORAL_TABLET | Freq: Three times a day (TID) | ORAL | 0 refills | Status: AC | PRN

## 2023-07-27 MED ORDER — IBUPROFEN 600 MG PO TABS
600.0000 mg | ORAL_TABLET | Freq: Once | ORAL | Status: AC
  Administered 2023-07-27: 600 mg via ORAL
  Filled 2023-07-27: qty 1

## 2023-07-27 NOTE — ED Notes (Signed)
See triage note  Presents with cont'd pain to right foot  States he was seen a couple of days go   Placed in CAM boot   States he has been walking on it  and it is causing mote pain and swelling   Has not f/u with ortho

## 2023-07-27 NOTE — ED Triage Notes (Signed)
Patient reports he is here for work note 

## 2023-07-27 NOTE — ED Provider Notes (Signed)
[facility] Provider Note    Event Date/Time   First MD Initiated Contact with Patient 07/27/23 [phone]     (approximate)   History   work note   HPI  Juan Cook is a 32 y.o. male with no PMH presents for further evaluation of a right ankle injury and a work note.  Patient was seen by this ED on 8/29 and diagnosed with Achilles tendinitis.  Ibuprofen was sent to his pharmacy and he was given follow-up with orthopedics.  Patient has been wearing the boot but continued to walk around the house.  He has not taken any ibuprofen or followed up with orthopedics yet.      Physical Exam   Triage Vital Signs: ED Triage Vitals [07/27/23 0951]  Encounter Vitals Group     BP (!) 123/90     Systolic BP Percentile      Diastolic BP Percentile      Pulse Rate 63     Resp 15     Temp 98 F (36.7 C)     Temp Source Oral     SpO2 99 %     Weight 155 lb (70.3 kg)     Height 5\' 7"  (1.702 m)     Head Circumference      Peak Flow      Pain Score 9     Pain Loc      Pain Education      Exclude from Growth Chart     Most recent vital signs: Vitals:   07/27/23 0951  BP: (!) 123/90  Pulse: 63  Resp: 15  Temp: 98 F (36.7 C)  SpO2: 99%    General: Awake, no distress.  CV:  Good peripheral perfusion.  Resp:  Normal effort.  Abd:  No distention.  Other:  Right ankle is swollen when compared to the left, no overlying skin changes or bruising.  Tender to palpation along the Achilles tendon.   ED Results / Procedures / Treatments   Labs (all labs ordered are listed, but only abnormal results are displayed) Labs Reviewed - No data to display   PROCEDURES:  Critical Care performed: No  Procedures   MEDICATIONS ORDERED IN ED: Medications  ibuprofen (ADVIL) tablet 600 mg (has no administration in time range)     IMPRESSION / MDM / ASSESSMENT AND PLAN / ED COURSE  I reviewed the triage vital signs and the nursing notes.                              32 year old male presents to the ED for a work note and evaluation of right ankle pain.  VSS in triage patient NAD on exam.  Differential diagnosis includes, but is not limited to, work note, ankle pain.  Patient's presentation is most consistent with acute, uncomplicated illness.  I do not feel that repeat imaging is warranted, as they believe patient's ongoing pain is because he is not rested appropriately.  I we will send patient's ibuprofen to a different pharmacy.  I encouraged him to follow-up with orthopedics. He will be given a work note.  I emphasized the importance of him resting and taking the ibuprofen.  Patient voiced understanding, all questions were answered and he was stable at discharge.    FINAL CLINICAL IMPRESSION(S) / ED DIAGNOSES   Final diagnoses:  Acute right ankle pain     Rx / DC Orders  ED Discharge Orders          Ordered    ibuprofen (ADVIL) 800 MG tablet  Every 8 hours PRN        07/27/23 1008             Note:  This document was prepared using Dragon voice recognition software and may include unintentional dictation errors.   Cameron Ali, PA-C 07/27/23 1016    Willy Eddy, MD 07/27/23 1400

## 2023-07-27 NOTE — Discharge Instructions (Addendum)
Please pick up the prescription for ibuprofen.  You can take 800 mg every 8 hours.  You can take 650 mg of Tylenol every 6 hours as needed for additional pain control.  It is important that you ice and elevate your ankle.  This will help with your swelling and improve your pain.  You can follow-up with orthopedics, or go to the walk-in clinic:   [facility] [address] [place], [place] [address] 9am-9pm everyday
